# Patient Record
Sex: Female | Born: 1980
Health system: Southern US, Community
[De-identification: ages and names within clinical notes are randomized; demographics above are authoritative.]

## PROBLEM LIST (undated history)

## (undated) DIAGNOSIS — D3910 Neoplasm of uncertain behavior of unspecified ovary: Secondary | ICD-10-CM

## (undated) DIAGNOSIS — D219 Benign neoplasm of connective and other soft tissue, unspecified: Secondary | ICD-10-CM

## (undated) DIAGNOSIS — Z5189 Encounter for other specified aftercare: Secondary | ICD-10-CM

## (undated) DIAGNOSIS — D649 Anemia, unspecified: Secondary | ICD-10-CM

## (undated) HISTORY — PX: ABDOMINAL HYSTERECTOMY: SHX81

## (undated) HISTORY — PX: ABDOMINAL SURGERY: SHX537

---

## 2013-12-11 ENCOUNTER — Emergency Department (HOSPITAL_BASED_OUTPATIENT_CLINIC_OR_DEPARTMENT_OTHER)
Admission: EM | Admit: 2013-12-11 | Discharge: 2013-12-11 | Disposition: A | Discharge status: Home / Self Care | Payer: BC Managed Care – PPO | Attending: Emergency Medicine | Admitting: Emergency Medicine

## 2013-12-11 ENCOUNTER — Encounter (HOSPITAL_BASED_OUTPATIENT_CLINIC_OR_DEPARTMENT_OTHER): Payer: Self-pay | Admitting: Emergency Medicine

## 2013-12-11 DIAGNOSIS — F17200 Nicotine dependence, unspecified, uncomplicated: Secondary | ICD-10-CM | POA: Insufficient documentation

## 2013-12-11 DIAGNOSIS — N644 Mastodynia: Secondary | ICD-10-CM | POA: Insufficient documentation

## 2013-12-11 NOTE — ED Notes (Signed)
Pt states that she noticed a knot on her right breast, under her arm. Pt states she just noticed it today and was concerned when she started having pain in the area as well.

## 2013-12-11 NOTE — Discharge Instructions (Signed)
Breast Tenderness Breast tenderness is a common problem for women of all ages. Breast tenderness may cause mild discomfort to severe pain. It has a variety of causes. Your health care provider will find out the likely cause of your breast tenderness by examining your breasts, asking you about symptoms, and ordering some tests. Breast tenderness usually does not mean you have breast cancer. HOME CARE INSTRUCTIONS  Breast tenderness often can be handled at home. You can try:  Getting fitted for a new bra that provides more support, especially during exercise.  Wearing a more supportive bra or sports bra while sleeping when your breasts are very tender.  If you have a breast injury, apply ice to the area:  Put ice in a plastic bag.  Place a towel between your skin and the bag.  Leave the ice on for 20 minutes, 2 3 times a day.  If your breasts are too full of milk as a result of breastfeeding, try:  Expressing milk either by hand or with a breast pump.  Applying a warm compress to the breasts for relief.  Taking over-the-counter pain relievers, if approved by your health care provider.  Taking other medicines that your health care provider prescribes. These may include antibiotic medicines or birth control pills. Over the long term, your breast tenderness might be eased if you:  Cut down on caffeine.  Reduce the amount of fat in your diet. Keep a log of the days and times when your breasts are most tender. This will help you and your health care provider find the cause of the tenderness and how to relieve it. Also, learn how to do breast exams at home. This will help you notice if you have an unusual growth or lump that could cause tenderness. SEEK MEDICAL CARE IF:   Any part of your breast is hard, red, and hot to the touch. This could be a sign of infection.  Fluid is coming out of your nipples (and you are not breastfeeding). Especially watch for blood or pus.  You have a fever  as well as breast tenderness.  You have a new or painful lump in your breast that remains after your menstrual period ends.  You have tried to take care of the pain at home, but it has not gone away.  Your breast pain is getting worse, or the pain is making it hard to do the things you usually do during your day. Document Released: 06/26/2008 Document Revised: 03/16/2013 Document Reviewed: 02/10/2013 [employer] Patient Information 2014 [employer]  

## 2013-12-11 NOTE — ED Provider Notes (Signed)
CSN: [id number]     Arrival date & time 12/11/13  1942 History  This chart was scribed for Julie Relic, MD by Marcha Dutton, ED Scribe. This patient was seen in room MH12/MH12 and the patient's care was started at 9:31 PM.    Chief Complaint  Patient presents with  . Breast Pain    The history is provided by the patient. No language interpreter was used.    HPI Comments: Karelly Dewalt is a 33 y.o. female who presents to the Emergency Department complaining of a lump to her right breast that she noticed a month ago. Pt notes it had been painless until yesterday when it became sore. Pt denies any abdominal pain, axilla pain, discharge and redness to the area, fever, and muscular strain.   History reviewed. No pertinent past medical history. History reviewed. No pertinent past surgical history. History reviewed. No pertinent family history.   History  Substance Use Topics  . Smoking status: Current Every Day Smoker  . Smokeless tobacco: Not on file  . Alcohol Use: Yes    OB History   Grav Para Term Preterm Abortions TAB SAB Ect Mult Living                  Review of Systems 10 Systems reviewed and all are negative for acute change except as noted in the HPI.   Allergies  Review of patient's allergies indicates no known allergies.   Home Medications   Prior to Admission medications   Not on File    Triage Vitals: BP 110/71  Pulse 78  Temp(Src) 98.8 F (37.1 C) (Oral)  Resp 18  Ht 5' (1.524 m)  Wt 95 lb (43.092 kg)  BMI 18.55 kg/m2  SpO2 100%  LMP 12/10/2013   Physical Exam  Nursing note and vitals reviewed. Constitutional: She is oriented to person, place, and time. She appears well-developed and well-nourished. No distress.  Awake, alert, nontoxic appearance.  HENT:  Head: Normocephalic and atraumatic.  Eyes: EOM are normal. Right eye exhibits no discharge. Left eye exhibits no discharge.  Neck: Neck supple.  Cardiovascular: Normal rate, regular  rhythm and normal heart sounds.  Exam reveals no gallop and no friction rub.   No murmur heard. Pulmonary/Chest: Effort normal and breath sounds normal. No respiratory distress. She has no wheezes. She has no rales. She exhibits no tenderness.  Abdominal: Soft. Bowel sounds are normal. There is no tenderness. There is no rebound and no guarding.  Musculoskeletal: She exhibits no tenderness.  Baseline ROM, no obvious new focal weakness. Entire upper half of the right breast diffusely tender without erythema, induration, or palpable mass. No discharge from right nipple. No palpable right breast mass. [place] present.  Neurological: She is alert and oriented to person, place, and time.  Mental status and motor strength appears baseline for patient and situation.  Skin: Skin is warm and dry. No rash noted. She is not diaphoretic.  Psychiatric: She has a normal mood and affect. Her behavior is normal.    ED Course  Procedures (including critical care time)   DIAGNOSTIC STUDIES: Oxygen Saturation is 100% on RA, normal by my interpretation.     COORDINATION OF CARE: 9:37 PM- Patient informed of clinical course, understand medical decision-making process, and agree with plan.   Labs Review Labs Reviewed - No data to display   Imaging Review No results found.    EKG Interpretation None     MDM  The patient appears reasonably  screened and/or stabilized for discharge and I doubt any other medical condition or other [facility] requiring further screening, evaluation, or treatment in the ED at this time prior to discharge.   Final diagnoses:  Breast pain in female    I doubt any other EMC precluding discharge at this time including, but not necessarily limited to the following:abscess requiring I&D at this time.  I personally performed the services described in this documentation, which was scribed in my presence. The recorded information has been reviewed and is accurate.    Julie Relic, MD 12/13/13 [phone]

## 2014-03-12 ENCOUNTER — Emergency Department (HOSPITAL_BASED_OUTPATIENT_CLINIC_OR_DEPARTMENT_OTHER)
Admission: EM | Admit: 2014-03-12 | Discharge: 2014-03-12 | Disposition: A | Discharge status: Home / Self Care | Payer: No Typology Code available for payment source | Attending: Emergency Medicine | Admitting: Emergency Medicine

## 2014-03-12 ENCOUNTER — Encounter (HOSPITAL_BASED_OUTPATIENT_CLINIC_OR_DEPARTMENT_OTHER): Payer: Self-pay | Admitting: Emergency Medicine

## 2014-03-12 ENCOUNTER — Emergency Department (HOSPITAL_BASED_OUTPATIENT_CLINIC_OR_DEPARTMENT_OTHER): Payer: No Typology Code available for payment source

## 2014-03-12 DIAGNOSIS — S298XXA Other specified injuries of thorax, initial encounter: Secondary | ICD-10-CM | POA: Insufficient documentation

## 2014-03-12 DIAGNOSIS — Y9389 Activity, other specified: Secondary | ICD-10-CM | POA: Diagnosis not present

## 2014-03-12 DIAGNOSIS — Y92410 Unspecified street and highway as the place of occurrence of the external cause: Secondary | ICD-10-CM | POA: Diagnosis not present

## 2014-03-12 DIAGNOSIS — F17200 Nicotine dependence, unspecified, uncomplicated: Secondary | ICD-10-CM | POA: Diagnosis not present

## 2014-03-12 DIAGNOSIS — M7918 Myalgia, other site: Secondary | ICD-10-CM

## 2014-03-12 LAB — URINALYSIS, ROUTINE W REFLEX MICROSCOPIC
Bilirubin Urine: NEGATIVE
Glucose, UA: NEGATIVE mg/dL
Hgb urine dipstick: NEGATIVE
Ketones, ur: NEGATIVE mg/dL
NITRITE: NEGATIVE
PH: 8.5 — AB (ref 5.0–8.0)
Protein, ur: 30 mg/dL — AB
Specific Gravity, Urine: 1.022 (ref 1.005–1.030)
UROBILINOGEN UA: 1 mg/dL (ref 0.0–1.0)

## 2014-03-12 LAB — URINE MICROSCOPIC-ADD ON

## 2014-03-12 MED ORDER — IBUPROFEN 800 MG PO TABS: 800.0000 mg | ORAL_TABLET | Freq: Once | ORAL | Status: DC
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
## 2014-03-13 NOTE — ED Provider Notes (Signed)
Medical screening examination/treatment/procedure(s) were performed by non-physician practitioner and as supervising physician I was immediately available for consultation/collaboration.   EKG Interpretation None       Orlie Dakin, MD 03/13/14 0003

## 2014-03-14 LAB — URINE CULTURE

## 2014-07-25 ENCOUNTER — Encounter (HOSPITAL_BASED_OUTPATIENT_CLINIC_OR_DEPARTMENT_OTHER): Payer: Self-pay | Admitting: *Deleted

## 2014-07-25 ENCOUNTER — Emergency Department (HOSPITAL_BASED_OUTPATIENT_CLINIC_OR_DEPARTMENT_OTHER)
Admission: EM | Admit: 2014-07-25 | Discharge: 2014-07-25 | Disposition: A | Discharge status: Home / Self Care | Payer: BC Managed Care – PPO | Attending: Emergency Medicine | Admitting: Emergency Medicine

## 2014-07-25 DIAGNOSIS — J028 Acute pharyngitis due to other specified organisms: Secondary | ICD-10-CM

## 2014-07-25 DIAGNOSIS — B9689 Other specified bacterial agents as the cause of diseases classified elsewhere: Secondary | ICD-10-CM | POA: Insufficient documentation

## 2014-07-25 DIAGNOSIS — R109 Unspecified abdominal pain: Secondary | ICD-10-CM | POA: Insufficient documentation

## 2014-07-25 DIAGNOSIS — Z86018 Personal history of other benign neoplasm: Secondary | ICD-10-CM | POA: Insufficient documentation

## 2014-07-25 DIAGNOSIS — Z720 Tobacco use: Secondary | ICD-10-CM | POA: Insufficient documentation

## 2014-07-25 HISTORY — DX: Neoplasm of uncertain behavior of unspecified ovary: D39.10

## 2014-07-25 LAB — RAPID STREP SCREEN (MED CTR MEBANE ONLY): STREPTOCOCCUS, GROUP A SCREEN (DIRECT): NEGATIVE

## 2014-07-25 MED ORDER — ACETAMINOPHEN 325 MG PO TABS
650.0000 mg | ORAL_TABLET | Freq: Once | ORAL | Status: AC
  Administered 2014-07-25: 650 mg via ORAL
  Filled 2014-07-25: qty 2

## 2014-07-25 NOTE — ED Notes (Signed)
Patient states she has a two week history of a sore throat with swollen tonsils.  This morning swelling is worse.  Denies fever.

## 2014-07-25 NOTE — ED Provider Notes (Signed)
CSN: [id number]     Arrival date & time 07/25/14  1502 History   First MD Initiated Contact with Patient 07/25/14 1624     Chief Complaint  Patient presents with  . Sore Throat     (Consider location/radiation/quality/duration/timing/severity/associated sxs/prior Treatment) HPI Comments: 33 year old female with no significant medical history except smoking presents with sore throat for 10 days swollen tonsils. No sick contacts. No infectious disease history. Pain with swallowing  Patient is a 33 y.o. female presenting with pharyngitis. The history is provided by the patient.  Sore Throat Pertinent negatives include no abdominal pain and no headaches.    Past Medical History  Diagnosis Date  . Granulosa cell tumor of ovary    Past Surgical History  Procedure Laterality Date  . Abdominal surgery      ovary tumor   No family history on file. History  Substance Use Topics  . Smoking status: Current Every Day Smoker  . Smokeless tobacco: Not on file  . Alcohol Use: Yes   OB History    No data available     Review of Systems  Constitutional: Negative for fever and chills.  HENT: Positive for sore throat.   Gastrointestinal: Negative for vomiting and abdominal pain.  Genitourinary: Positive for flank pain.  Musculoskeletal: Negative for back pain, neck pain and neck stiffness.  Skin: Negative for rash.  Neurological: Negative for light-headedness and headaches.      Allergies  Review of patient's allergies indicates no known allergies.  Home Medications   Prior to Admission medications   Not on File   BP 124/76 mmHg  Pulse 94  Temp(Src) 98.6 F (37 C) (Oral)  Resp 16  SpO2 100%  LMP 07/18/2014 (Exact Date) Physical Exam  Constitutional: She is oriented to person, place, and time. She appears well-developed and well-nourished.  HENT:  Head: Normocephalic and atraumatic.  Mild erythema posterior pharynx, no trismus, no unilateral swelling  Eyes: Right eye  exhibits no discharge. Left eye exhibits no discharge.  Neck: Normal range of motion. Neck supple. No tracheal deviation present.  Cardiovascular: Normal rate.   Neurological: She is alert and oriented to person, place, and time.  Skin: Skin is warm. No rash noted.  Psychiatric: She has a normal mood and affect.  Nursing note and vitals reviewed.   ED Course  Procedures (including critical care time) Labs Review Labs Reviewed  RAPID STREP SCREEN  CULTURE, GROUP A STREP    Imaging Review No results found.   EKG Interpretation None      MDM   Final diagnoses:  Acute pharyngitis due to other specified organisms   Well-appearing overall healthy female with clinical pharyngitis. Strep negative. Discussed supportive care and outpatient follow  Results and differential diagnosis were discussed with the patient/parent/guardian. Close follow up outpatient was discussed, comfortable with the plan.   Medications  acetaminophen (TYLENOL) tablet 650 mg (650 mg Oral Given 07/25/14 1716)    Filed Vitals:   07/25/14 1518 07/25/14 1717  BP: 103/46 124/76  Pulse: 94   Temp: 98.6 F (37 C)   TempSrc: Oral   Resp: 18 16  SpO2: 100% 100%    Final diagnoses:  Acute pharyngitis due to other specified organisms       Mariea Clonts, MD 07/25/14 1722

## 2014-07-25 NOTE — Discharge Instructions (Signed)
If you were given medicines take as directed.  If you are on coumadin or contraceptives realize their levels and effectiveness is altered by many different medicines.  If you have any reaction (rash, tongues swelling, other) to the medicines stop taking and see a physician.   Please follow up as directed and return to the ER or see a physician for new or worsening symptoms.  Thank you. Filed Vitals:   07/25/14 1518  BP: 103/46  Pulse: 94  Temp: 98.6 F (37 C)  TempSrc: Oral  Resp: 18  SpO2: 100%

## 2014-07-27 LAB — CULTURE, GROUP A STREP

## 2015-06-05 ENCOUNTER — Encounter (HOSPITAL_BASED_OUTPATIENT_CLINIC_OR_DEPARTMENT_OTHER): Payer: Self-pay | Admitting: *Deleted

## 2015-06-05 ENCOUNTER — Emergency Department (HOSPITAL_BASED_OUTPATIENT_CLINIC_OR_DEPARTMENT_OTHER)
Admission: EM | Admit: 2015-06-05 | Discharge: 2015-06-05 | Disposition: A | Discharge status: Home / Self Care | Payer: BLUE CROSS/BLUE SHIELD | Attending: Emergency Medicine | Admitting: Emergency Medicine

## 2015-06-05 DIAGNOSIS — J111 Influenza due to unidentified influenza virus with other respiratory manifestations: Secondary | ICD-10-CM | POA: Diagnosis not present

## 2015-06-05 DIAGNOSIS — Z86018 Personal history of other benign neoplasm: Secondary | ICD-10-CM | POA: Insufficient documentation

## 2015-06-05 DIAGNOSIS — R059 Cough, unspecified: Secondary | ICD-10-CM

## 2015-06-05 DIAGNOSIS — Z720 Tobacco use: Secondary | ICD-10-CM | POA: Insufficient documentation

## 2015-06-05 DIAGNOSIS — R05 Cough: Secondary | ICD-10-CM

## 2015-06-05 DIAGNOSIS — M791 Myalgia: Secondary | ICD-10-CM | POA: Diagnosis present

## 2015-06-05 DIAGNOSIS — R69 Illness, unspecified: Secondary | ICD-10-CM

## 2015-06-05 HISTORY — DX: Benign neoplasm of connective and other soft tissue, unspecified: D21.9

## 2015-06-05 MED ORDER — BENZONATATE 100 MG PO CAPS: 200.0000 mg | ORAL_CAPSULE | Freq: Two times a day (BID) | ORAL | Status: DC | PRN

## 2015-06-05 MED ORDER — IBUPROFEN 800 MG PO TABS
800.0000 mg | ORAL_TABLET | Freq: Once | ORAL | Status: AC
  Administered 2015-06-05: 800 mg via ORAL
  Filled 2015-06-05: qty 1

## 2015-06-05 MED ORDER — IBUPROFEN 800 MG PO TABS: 800.0000 mg | ORAL_TABLET | Freq: Three times a day (TID) | ORAL | Status: DC

## 2015-06-05 NOTE — ED Notes (Signed)
Body aches, chills, cough, and headache. Symptoms x 2 days.

## 2015-06-05 NOTE — ED Provider Notes (Signed)
CSN: [id number]     Arrival date & time 06/05/15  1428 History   First MD Initiated Contact with Patient 06/05/15 1446     Chief Complaint  Patient presents with  . Generalized Body Aches     (Consider location/radiation/quality/duration/timing/severity/associated sxs/prior Treatment) The history is provided by the patient and medical records. No language interpreter was used.    Julie Novak is a 34 y.o. 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 Patient reports that her headache is mild, generalized and throbbing. No vision changes or diplopia. No neck stiffness or neck pain. Patient denies known tick bites. She denies fevers at home. Patient denies rash.  Patient reports drinking some TheraFlu last night without relief. No other treatments prior to arrival. She denies sick contacts. She also denies chest pain, shortness of breath, wheezing, abdominal pain, nausea, vomiting, diarrhea, weakness, dizziness, syncope or dysuria.  Past Medical History  Diagnosis Date  . Granulosa cell tumor of ovary   . Granular cell tumor    Past Surgical History  Procedure Laterality Date  . Abdominal surgery      ovary tumor   No family history on file. Social History  Substance Use Topics  . Smoking status: Current Some Day Smoker    Types: Cigarettes  . Smokeless tobacco: None  . Alcohol Use: Yes   OB History    No data available     Review of Systems  Constitutional: Positive for fatigue. Negative for fever, chills and appetite change.  HENT: Positive for congestion ( minimal) and sore throat. Negative for ear discharge, ear pain, mouth sores, postnasal drip, rhinorrhea and sinus pressure.   Eyes: Negative for visual disturbance.  Respiratory: Positive for cough. Negative for chest  tightness, shortness of breath, wheezing and stridor.   Cardiovascular: Negative for chest pain, palpitations and leg swelling.  Gastrointestinal: Negative for nausea, vomiting, abdominal pain and diarrhea.  Genitourinary: Negative for dysuria, urgency, frequency and hematuria.  Musculoskeletal: Negative for myalgias, back pain, arthralgias and neck stiffness.  Skin: Negative for rash.  Neurological: Positive for headaches. Negative for syncope, light-headedness and numbness.  Hematological: Negative for adenopathy.  Psychiatric/Behavioral: The patient is not nervous/anxious.   All other systems reviewed and are negative.     Allergies  Review of patient's allergies indicates no known allergies.  Home Medications   Prior to Admission medications   Medication Sig Start Date End Date Taking? Authorizing Provider  benzonatate (TESSALON) 100 MG capsule Take 2 capsules (200 mg total) by mouth 2 (two) times daily as needed for cough. 06/05/15   Aamilah Augenstein, PA-C  ibuprofen (ADVIL,MOTRIN) 800 MG tablet Take 1 tablet (800 mg total) by mouth 3 (three) times daily. 06/05/15   Rodrick Payson, PA-C   BP 115/65 mmHg  Pulse 95  Temp(Src) 98.2 F (36.8 C) (Oral)  Resp 18  Ht 5' (1.524 m)  Wt 95 lb (43.092 kg)  BMI 18.55 kg/m2  SpO2 100%  LMP 06/04/2015 Physical Exam  Constitutional: She is oriented to person, place, and time. She appears well-developed and well-nourished. No distress.  HENT:  Head: Normocephalic and atraumatic.  Right Ear: Tympanic membrane, external ear and ear canal normal.  Left Ear: Tympanic membrane, external ear and ear canal normal.  Nose: Mucosal edema and rhinorrhea present. No epistaxis. Right sinus exhibits no maxillary sinus  tenderness and no frontal sinus tenderness. Left sinus exhibits no maxillary sinus tenderness and no frontal sinus tenderness.  Mouth/Throat: Uvula is midline, oropharynx is clear and moist and mucous membranes are normal. Mucous  membranes are not pale and not cyanotic. No oropharyngeal exudate, posterior oropharyngeal edema, posterior oropharyngeal erythema or tonsillar abscesses.  Eyes: Conjunctivae are normal. Pupils are equal, round, and reactive to light.  Neck: Normal range of motion and full passive range of motion without pain.  Cardiovascular: Normal rate, normal heart sounds and intact distal pulses.   No murmur heard. Pulmonary/Chest: Effort normal and breath sounds normal. No accessory muscle usage or stridor. No tachypnea. No respiratory distress. She has no decreased breath sounds. She has no wheezes. She has no rhonchi.  Clear and equal breath sounds without focal wheezes, rhonchi, rales  Abdominal: Soft. Bowel sounds are normal. There is no tenderness.  Musculoskeletal: Normal range of motion.  Lymphadenopathy:    She has no cervical adenopathy.  Neurological: She is alert and oriented to person, place, and time.  Skin: Skin is warm and dry. No rash noted. She is not diaphoretic.  Psychiatric: She has a normal mood and affect.  Nursing note and vitals reviewed.   ED Course  Procedures (including critical care time) Labs Review Labs Reviewed - No data to display  Imaging Review No results found. I have personally reviewed and evaluated these images and lab results as part of my medical decision-making.   EKG Interpretation None      MDM   Final diagnoses:  Influenza-like illness  Cough   Julie Novak presents with influenza like illness vs URI with cough.  Pt afebrile with clear and equal breath sounds and symptoms less than 48 hours.  Doubt PNA and no indication for CXR at this time.  Patients symptoms are consistent with URI, likely viral etiology. Discussed that antibiotics are not indicated for viral infections. Pt will be discharged with symptomatic treatment.  Verbalizes understanding and is agreeable with plan. Pt is hemodynamically stable & in NAD prior to dc.  BP 115/65 mmHg   Pulse 95  Temp(Src) 98.2 F (36.8 C) (Oral)  Resp 18  Ht 5' (1.524 m)  Wt 95 lb (43.092 kg)  BMI 18.55 kg/m2  SpO2 100%  LMP 06/04/2015    Jarrett Soho Makia Bossi, PA-C 06/05/15 1520  Pattricia Boss, MD 06/08/15 [phone]

## 2015-06-05 NOTE — Discharge Instructions (Signed)
1. Medications: mucinex, tessalon, usual home medications 2. Treatment: rest, drink plenty of fluids, take tylenol or ibuprofen for fever control and body aches 3. Follow Up: Please followup with your primary doctor in 3 days for discussion of your diagnoses and further evaluation after today's visit; if you do not have a primary care doctor use the resource guide provided to find one; Return to the ER for high fevers, difficulty breathing or other concerning symptoms     Cough, Adult Coughing is a reflex that clears your throat and your airways. Coughing helps to heal and protect your lungs. It is normal to cough occasionally, but a cough that happens with other symptoms or lasts a long time may be a sign of a condition that needs treatment. A cough may last only 2-3 weeks (acute), or it may last longer than 8 weeks (chronic). CAUSES Coughing is commonly caused by:  Breathing in substances that irritate your lungs.  A viral or bacterial respiratory infection.  Allergies.  Asthma.  Postnasal drip.  Smoking.  Acid backing up from the stomach into the esophagus (gastroesophageal reflux).  Certain medicines.  Chronic lung problems, including COPD (or rarely, lung cancer).  Other medical conditions such as heart failure. HOME CARE INSTRUCTIONS  Pay attention to any changes in your symptoms. Take these actions to help with your discomfort:  Take medicines only as told by your health care provider.  If you were prescribed an antibiotic medicine, take it as told by your health care provider. Do not stop taking the antibiotic even if you start to feel better.  Talk with your health care provider before you take a cough suppressant medicine.  Drink enough fluid to keep your urine clear or pale yellow.  If the air is dry, use a cold steam vaporizer or humidifier in your bedroom or your home to help loosen secretions.  Avoid anything that causes you to cough at work or at home.  If  your cough is worse at night, try sleeping in a semi-upright position.  Avoid cigarette smoke. If you smoke, quit smoking. If you need help quitting, ask your health care provider.  Avoid caffeine.  Avoid alcohol.  Rest as needed. SEEK MEDICAL CARE IF:   You have new symptoms.  You cough up pus.  Your cough does not get better after 2-3 weeks, or your cough gets worse.  You cannot control your cough with suppressant medicines and you are losing sleep.  You develop pain that is getting worse or pain that is not controlled with pain medicines.  You have a fever.  You have unexplained weight loss.  You have night sweats. SEEK IMMEDIATE MEDICAL CARE IF:  You cough up blood.  You have difficulty breathing.  Your heartbeat is very fast.   This information is not intended to replace advice given to you by your health care provider. Make sure you discuss any questions you have with your health care provider.   Document Released: 01/10/2011 Document Revised: 04/04/2015 Document Reviewed: 09/20/2014 Elsevier Interactive Patient Education 2016 [employer].    Emergency Department Resource Guide 1) Find a Doctor and Pay Out of Pocket Although you won't have to find out who is covered by your insurance plan, it is a good idea to ask around and get recommendations. You will then need to call the office and see if the doctor you have chosen will accept you as a new patient and what types of options they offer for patients who are  self-pay. Some doctors offer discounts or will set up payment plans for their patients who do not have insurance, but you will need to ask so you aren't surprised when you get to your appointment.  2) Contact Your Local Health Department Not all health departments have doctors that can see patients for sick visits, but many do, so it is worth a call to see if yours does. If you don't know where your local health department is, you can check in your phone  book. The CDC also has a tool to help you locate your state's health department, and many state websites also have listings of all of their local health departments.  3) Find a [facility] If your illness is not likely to be very severe or complicated, you may want to try a walk in clinic. These are popping up all over the country in pharmacies, drugstores, and shopping centers. They're usually staffed by nurse practitioners or physician assistants that have been trained to treat common illnesses and complaints. They're usually fairly quick and inexpensive. However, if you have serious medical issues or chronic medical problems, these are probably not your best option.  No Primary Care Doctor: - Call Health Connect at  [phone] - they can help you locate a primary care doctor that  accepts your insurance, provides certain services, etc. - Physician Referral Service- [phone]  Chronic Pain Problems: Organization         Address  Phone   Notes  [facility]  [phone] Patients need to be referred by their primary care doctor.   Medication Assistance: Organization         Address  Phone   Notes  [facility] [address] [place], [place] [address] [phone] --Must be a resident of [place] -- Must have NO insurance coverage whatsoever (no Medicaid/ Medicare, etc.) -- The pt. MUST have a primary care doctor that directs their care regularly and follows them in the community   MedAssist  [phone]   [employer]  [phone]    Agencies that provide inexpensive medical care: Organization         Address  Phone   Notes  [place]  [phone]   [facility]    [phone]   [facility] [place], [place] [address] [phone]   [place] [address] [place] [phone]   Planned  Parenthood    [phone]   [facility]    [phone]   [facility] [address], [place] Phone:  [phone], Fax:  [phone] Hours of Operation:  9 am - 6 pm, M-F.  Also accepts Medicaid/Medicare and self-pay.  [facility] for [place] [address] [place], [address], [place] Phone: [phone], Fax: [phone]. Hours of Operation:  8:30 am - 5:30 pm, M-F.  Also accepts Medicaid and self-pay.  [facility] [place] [address] [place] Phone: [phone]   [place], [place], [place] [phone], Ext. [phone] Mondays & Thursdays: 7-9 AM.  First 15 patients are seen on a first come, first serve basis.    [place] Providers:  Organization         Address  Phone   Notes  [facility] [address], Arlis Porta, [place] [phone] Also accepts self-pay patients.  [facility] [address], [place]  [phone]   [place], [address], [place] [phone]   [facility] [address], [place] [phone]   Lucianne Lei [address], [place]   [phone] Only accepts [place] Access [place] patients after they have their name applied to their card.   Self-Pay (no insurance) in [place] [facility] [place]:  Organization         Address  Phone   Notes  Sickle Cell Patients, [facility] [place] [phone]   [facility] [place] [phone]   [facility] [place]  [place], [place], [place] [phone]   [facility]/Dr. Osei-Bonsu  [address] [place] or [address], [place] [phone] Phone number for both [place] and [place] locations is the same.    [facility] [address], [place] [phone]   [facility] [address], [place] or [address] [phone]   [facility] [address], [place] [phone], phone; [phone], fax Sees patients 1st and 3rd Saturday of every month.  Must not qualify for public or private insurance (i.e. Medicaid, Medicare, [place] Health Choice, Veterans' Benefits)  Household income should be no more than 200% of the poverty level The clinic cannot treat you if you are pregnant or think you are pregnant  Sexually transmitted diseases are not treated at the clinic.    Dental Care: Organization         Address  Phone  Notes  [facility] [place] [phone] Accepts children up to age 37 who are enrolled in [place] or [place]; pregnant women with a Medicaid card; and children who have applied for Medicaid or [place] Health Choice, but were declined, whose parents can pay a reduced fee at time of service.  [facility]  [address], [place] [phone] Accepts children up to age 31 who are enrolled in [place] or [place]; pregnant women with a Medicaid card; and children who have applied for Medicaid or [place] Health Choice, but were declined, whose parents can pay a reduced fee at time of service.  [place] Adult [facility] Access PROGRAM  [place] [phone] Patients are seen by appointment only. Walk-ins are not accepted. [place] will see patients 14 years of age and older. Monday - Tuesday (8am-5pm) Most Wednesdays (8:30-5pm) $30 per visit, cash only  [facility] Adult [facility] Access PROGRAM  [address], [facility] [phone] Patients are seen by appointment only. Walk-ins are not accepted. [place] will see patients 80  years of age and older. One Wednesday Evening (Monthly: Volunteer Based).  $30 per visit, cash only  [place]  [phone] for adults; Children under age 2, call Graduate Pediatric Dentistry at [phone]. Children aged 5-14, please call [phone] to request a pediatric application.  Dental services are provided in all areas of dental care including fillings, crowns and bridges, complete and partial dentures, implants, gum treatment, root canals,  and extractions. Preventive care is also provided. Treatment is provided to both adults and children. Patients are selected via a lottery and there is often a waiting list.   [facility] [address], [place]  [phone] [email or web address]   [facility] [address] [place], [place] [phone], Ext. [phone] Second and Fourth Thursday of each month, opens at 6:30 AM; Clinic ends at 9 AM.  Patients are seen on a first-come first-served basis, and a limited number are seen during each clinic.   [facility]  [address] Hillard Danker [place], [place] [phone]   Eligibility Requirements You must have lived in [place], [place], or [place] counties for at least the last three months.   You cannot be eligible for state or federal sponsored [employer], including [employer], [place], or [employer].   You generally cannot be eligible for healthcare insurance through your employer.    How to apply: Eligibility screenings are held every Tuesday and Wednesday afternoon from 1:00 pm until 4:00 pm. You do not need an appointment for the interview!  [facility] [address], [place], [place]   [place]  [place] Department  [place]  [phone]    Behavioral Health Resources in the Community: Intensive Outpatient  Programs Organization         Address  Phone  Notes  [place]. [address] [place], [place] [phone]   [facility] Outpatient [address] [place], [place]   ADS: Alcohol & Drug Svcs [address] [place], [place]   [place] [address],  [place], [place] or [phone]   Substance Abuse Resources Organization         Address  Phone  Notes  Alcohol and Drug Services  [phone]   [place]  [phone]   [facility] [place]   Chinita Pester  [phone]   Residential & Outpatient Substance Abuse Program  [phone]   Psychological Services Organization         Address  Phone  Notes  [facility] [place]  [place]  [phone]   [place] [address], [place] or [phone]    Mobile Crisis Teams Organization         Address  Phone  Notes  Therapeutic Alternatives, Mobile Crisis Care Unit  [phone]   [facility]  [address] [place], [place]   [place] [address], [place] [phone]    Self-Help/Support Groups Organization         Address  Phone             Notes  [place]. of [place] - variety of support groups  [place] Call for more information  Narcotics Anonymous (NA), [facility]aring [facility] [address], [employer] [place]  2 meetings at this location   Special educational needs teacher         Address  Phone  Notes  ASAP Residential Treatment [place],    [place]  [phone]   [facility]  [address] [place] [phone], [place], [place]   [place], [place] [phone] Admissions: 8am-3pm M-F  Incentives Substance [place] [address],    [place], [place]  [phone]   [facility] [address] [place], [place], [place]   [facility] [address]  [place], [place]   [facility]ight Programs - Intensive Outpatient [address], [place], [place]   [facility] ([place].,  [place], [place] [phone] or [phone]   [facility] (RTS) [address], [place], [place] Accepts Medicaid  [facility] [place] [address],  [place] [phone] Substance Abuse/Addiction Treatment   [facility] Organization         Address  Phone  Notes  [facility]  [phone]   Domenic Schwab, PhD [address] Arlis Porta [place], [place]   [phone] or [phone]   [place], [place] [phone]   [facility] [address], [place], [place] [phone] Insurance/Medicaid/sponsorship through [employer] and Families [address]                                    [place], [place] [phone] [place] [address], [place] [phone]    Dr. Adele Schilder  [phone]   [facility] Dept. 1) [address], [place] 2) [place] 3)  [place] [address], [place] [phone]  [phone]   [place] [phone] or [phone] (After Hours)

## 2015-12-18 ENCOUNTER — Encounter (HOSPITAL_BASED_OUTPATIENT_CLINIC_OR_DEPARTMENT_OTHER): Payer: Self-pay | Admitting: Emergency Medicine

## 2015-12-18 ENCOUNTER — Emergency Department (HOSPITAL_BASED_OUTPATIENT_CLINIC_OR_DEPARTMENT_OTHER)
Admission: EM | Admit: 2015-12-18 | Discharge: 2015-12-18 | Disposition: A | Discharge status: Home / Self Care | Payer: BLUE CROSS/BLUE SHIELD | Attending: Emergency Medicine | Admitting: Emergency Medicine

## 2015-12-18 DIAGNOSIS — S61214A Laceration without foreign body of right ring finger without damage to nail, initial encounter: Secondary | ICD-10-CM | POA: Diagnosis not present

## 2015-12-18 DIAGNOSIS — W25XXXA Contact with sharp glass, initial encounter: Secondary | ICD-10-CM | POA: Insufficient documentation

## 2015-12-18 DIAGNOSIS — IMO0002 Reserved for concepts with insufficient information to code with codable children: Secondary | ICD-10-CM

## 2015-12-18 DIAGNOSIS — Y999 Unspecified external cause status: Secondary | ICD-10-CM | POA: Diagnosis not present

## 2015-12-18 DIAGNOSIS — Y9389 Activity, other specified: Secondary | ICD-10-CM | POA: Insufficient documentation

## 2015-12-18 DIAGNOSIS — Z23 Encounter for immunization: Secondary | ICD-10-CM | POA: Insufficient documentation

## 2015-12-18 DIAGNOSIS — Y929 Unspecified place or not applicable: Secondary | ICD-10-CM | POA: Diagnosis not present

## 2015-12-18 DIAGNOSIS — F17200 Nicotine dependence, unspecified, uncomplicated: Secondary | ICD-10-CM | POA: Insufficient documentation

## 2015-12-18 MED ORDER — MUPIROCIN 2 % EX OINT: TOPICAL_OINTMENT | CUTANEOUS | Status: AC

## 2015-12-18 MED ORDER — TETANUS-DIPHTH-ACELL PERTUSSIS 5-2.5-18.5 LF-MCG/0.5 IM SUSP
0.5000 mL | Freq: Once | INTRAMUSCULAR | Status: AC
  Administered 2015-12-18: 0.5 mL via INTRAMUSCULAR
  Filled 2015-12-18: qty 0.5

## 2015-12-18 MED ORDER — LIDOCAINE-EPINEPHRINE-TETRACAINE (LET) SOLUTION
3.0000 mL | Freq: Once | NASAL | Status: AC
  Administered 2015-12-18: 3 mL via TOPICAL
  Filled 2015-12-18: qty 3

## 2015-12-18 NOTE — ED Notes (Signed)
0.5cm laceration to middle of right middle finger, superficial, 1cm laceration to same spot on right ring finger, slightly deeper, both approximate well and bleeding is controlled with bandage

## 2015-12-18 NOTE — ED Notes (Signed)
Pt verbalizes understanding of d/c instructions and denies any further needs at this time. 

## 2015-12-18 NOTE — ED Notes (Signed)
Pt in with lacerations to 3rd and 4th fingers on R hand from glass while washing dishes, dressed and bleeding controlled. Pt alert, interactive, in NAD.

## 2015-12-18 NOTE — ED Notes (Signed)
Provider now at bedside.

## 2015-12-18 NOTE — Discharge Instructions (Signed)
Keep wound clean with mild soap and water. Keep area covered with a topical antibiotic ointment and bandage, keep bandage dry, and do not submerge in water for 24 hours. Ice and elevate for additional pain relief and swelling. Ibuprofen for additional pain relief. Follow up with your primary care doctor or the [facility]gent [place] in approximately 7 days for wound recheck and suture removal. Monitor area for signs of infection to include, but not limited to: increasing pain, spreading redness, drainage/pus, worsening swelling, or fevers. Return to emergency department for emergent changing or worsening symptoms.   WOUND CARE  Keep area clean and dry for 24 hours. Do not remove bandage, if applied.  After 24 hours,you should change it at least once a day. Also, change the dressing if it becomes wet or dirty, or as directed by your caregiver.   Wash the wound with soap and water 2 times a day. Rinse the wound off with water to remove all soap. Pat the wound dry with a clean towel.   You may shower as usual after the first 24 hours. Do not soak the wound in water until the sutures are removed.   Do not apply any ointments or creams to the wound while stitches/staples are in place, as this may cause delayed healing.  Return if you experience any of the following signs of infection: Swelling, redness, pus drainage, streaking, fever >101.0 F  Return if you experience excessive bleeding that does not stop after 15-20 minutes of constant, firm pressure.

## 2015-12-18 NOTE — ED Provider Notes (Signed)
CSN: [id number]     Arrival date & time 12/18/15  1940 History   First MD Initiated Contact with Patient 12/18/15 2022     Chief Complaint  Patient presents with  . Extremity Laceration    (Consider location/radiation/quality/duration/timing/severity/associated sxs/prior Treatment) The history is provided by the patient and medical records. No language interpreter was used.   Julie Novak is a 34 y.o. female  who presents to the Emergency Department for a laceration of her right middle and ring finger which occurred just prior to arrival. Patient was washing the dishes when she cut her hand on glass. Patient is complaining of pain along her cut on her ring finger which is constant, but worse with movement. Denies numbness or tingling. Unknown tetanus status. No medications taken prior to arrival for symptoms.  Past Medical History  Diagnosis Date  . Granulosa cell tumor of ovary   . Granular cell tumor    Past Surgical History  Procedure Laterality Date  . Abdominal surgery      ovary tumor   History reviewed. No pertinent family history. Social History  Substance Use Topics  . Smoking status: Current Some Day Smoker    Types: Cigarettes  . Smokeless tobacco: None  . Alcohol Use: Yes   OB History    No data available     Review of Systems  Skin: Positive for wound.  Neurological: Negative for numbness.      Allergies  Review of patient's allergies indicates no known allergies.  Home Medications   Prior to Admission medications   Medication Sig Start Date End Date Taking? Authorizing Provider  benzonatate (TESSALON) 100 MG capsule Take 2 capsules (200 mg total) by mouth 2 (two) times daily as needed for cough. 06/05/15   Julie Muthersbaugh, PA-C  ibuprofen (ADVIL,MOTRIN) 800 MG tablet Take 1 tablet (800 mg total) by mouth 3 (three) times daily. 06/05/15   Julie Muthersbaugh, PA-C  mupirocin ointment (BACTROBAN) 2 % Apply to affected area twice daily. 12/18/15   Julie Novak  Novak Julie Loa, PA-C   BP 118/90 mmHg  Temp(Src) 98.7 F (37.1 C) (Oral)  Resp 18  Ht 5' (1.524 m)  Wt 45.36 kg  BMI 19.53 kg/m2  SpO2 100%  LMP 12/09/2015 Physical Exam  Constitutional: She is oriented to person, place, and time. She appears well-developed and well-nourished.  Alert and in no acute distress  HENT:  Head: Normocephalic and atraumatic.  Cardiovascular: Normal rate, regular rhythm and normal heart sounds.   No murmur heard. Pulmonary/Chest: Effort normal and breath sounds normal. No respiratory distress. She has no wheezes. She has no rales.  Musculoskeletal:  RUE with full ROM and 5/5 muscle strength including grip strength.   Neurological: She is alert and oriented to person, place, and time.  RUE neurovascularly intact.   Skin: Skin is warm and dry.  Right middle finger with superficial abrasion.  Right ring finger with 1.5 cm laceration.   Nursing note and vitals reviewed.   ED Course  Procedures (including critical care time)  LACERATION REPAIR Performed by: Julie Novak Authorized by: Julie Novak Consent: Verbal consent obtained. Risks and benefits: risks, benefits and alternatives were discussed Consent given by: patient Patient identity confirmed: provided demographic data Prepped and Draped in normal sterile fashion Wound explored Laceration Location: right ring finger Laceration Length: 1cm No Foreign Bodies seen or palpated Anesthesia: topical LET Irrigation method: syringe Amount of cleaning: standard Skin closure: 6-0  Number of sutures: 2 Technique: simple interrupted Patient  tolerance: Patient tolerated the procedure well with no immediate complications. Labs Review Labs Reviewed - No data to display  Imaging Review No results found. I have personally reviewed and evaluated these images and lab results as part of my medical decision-making.   EKG Interpretation None      MDM   Final diagnoses:  Laceration    Patient presents with superficial laceration to right middle finger and 1 cm laceration on the ring finger. Wounds thoroughly cleaned in ED. Laceration repaired as dictated above. Home care instructions and follow-up care for suture removal discussed. Return precautions given and all questions answered.    [facility] Julie Oatley, PA-C 12/18/15 2145  Julie Morgan, MD 12/19/15 [phone]

## 2016-04-07 ENCOUNTER — Emergency Department (HOSPITAL_BASED_OUTPATIENT_CLINIC_OR_DEPARTMENT_OTHER)
Admission: EM | Admit: 2016-04-07 | Discharge: 2016-04-07 | Disposition: A | Discharge status: Home / Self Care | Payer: BLUE CROSS/BLUE SHIELD | Attending: Emergency Medicine | Admitting: Emergency Medicine

## 2016-04-07 ENCOUNTER — Encounter (HOSPITAL_BASED_OUTPATIENT_CLINIC_OR_DEPARTMENT_OTHER): Payer: Self-pay | Admitting: Adult Health

## 2016-04-07 DIAGNOSIS — D649 Anemia, unspecified: Secondary | ICD-10-CM | POA: Diagnosis not present

## 2016-04-07 DIAGNOSIS — R0602 Shortness of breath: Secondary | ICD-10-CM | POA: Diagnosis present

## 2016-04-07 DIAGNOSIS — F17210 Nicotine dependence, cigarettes, uncomplicated: Secondary | ICD-10-CM | POA: Insufficient documentation

## 2016-04-07 HISTORY — DX: Anemia, unspecified: D64.9

## 2016-04-07 LAB — CBC
HCT: 27.3 % — ABNORMAL LOW (ref 36.0–46.0)
HEMOGLOBIN: 8.6 g/dL — AB (ref 12.0–15.0)
MCH: 20.2 pg — ABNORMAL LOW (ref 26.0–34.0)
MCHC: 31.5 g/dL (ref 30.0–36.0)
MCV: 64.2 fL — ABNORMAL LOW (ref 78.0–100.0)
Platelets: 333 10*3/uL (ref 150–400)
RBC: 4.25 MIL/uL (ref 3.87–5.11)
RDW: 19 % — AB (ref 11.5–15.5)
WBC: 4.5 10*3/uL (ref 4.0–10.5)

## 2016-04-07 LAB — URINALYSIS, ROUTINE W REFLEX MICROSCOPIC
Bilirubin Urine: NEGATIVE
Glucose, UA: NEGATIVE mg/dL
KETONES UR: NEGATIVE mg/dL
Nitrite: POSITIVE — AB
PH: 5.5 (ref 5.0–8.0)
Protein, ur: NEGATIVE mg/dL
Specific Gravity, Urine: 1.021 (ref 1.005–1.030)

## 2016-04-07 LAB — BASIC METABOLIC PANEL
ANION GAP: 6 (ref 5–15)
BUN: 8 mg/dL (ref 6–20)
CO2: 28 mmol/L (ref 22–32)
Calcium: 9.2 mg/dL (ref 8.9–10.3)
Chloride: 102 mmol/L (ref 101–111)
Creatinine, Ser: 0.5 mg/dL (ref 0.44–1.00)
GFR calc non Af Amer: >60 mL/min (ref >60)
Glucose, Bld: 88 mg/dL (ref 65–99)
POTASSIUM: 3.3 mmol/L — AB (ref 3.5–5.1)
Sodium: 136 mmol/L (ref 135–145)

## 2016-04-07 LAB — URINE MICROSCOPIC-ADD ON

## 2016-04-07 LAB — PREGNANCY, URINE: Preg Test, Ur: NEGATIVE

## 2016-04-07 MED ORDER — POTASSIUM CHLORIDE CRYS ER 20 MEQ PO TBCR
40.0000 meq | EXTENDED_RELEASE_TABLET | Freq: Once | ORAL | Status: AC
  Administered 2016-04-07: 40 meq via ORAL
  Filled 2016-04-07: qty 2

## 2016-04-07 MED ORDER — SODIUM CHLORIDE 0.9 % IV BOLUS (SEPSIS)
1000.0000 mL | Freq: Once | INTRAVENOUS | Status: AC
  Administered 2016-04-07: 1000 mL via INTRAVENOUS

## 2016-04-07 NOTE — ED Triage Notes (Signed)
Presents with fatigue and SOB began last month , saw her physician at that time and her HgB was 7, she refused a blood transfusion and decided to take her iron pills. Two weeks ago she had a heavy period with large clots and since then has been feeling worse. Pale mucous membranes.

## 2016-04-07 NOTE — ED Notes (Signed)
PA at bedside.

## 2016-04-07 NOTE — Discharge Instructions (Signed)
Continue taking your iron pills at home as prescribed. I also recommend continuing to drink water at home to remain related. Follow-up with your gynecologist as soon as possible for further management and evaluation of your heavy menstrual periods. I also recommend following up with your primary care provider within the next week for follow-up regarding urine anemia. Please return to the Emergency Department if symptoms worsen or new onset of fever, headache, visual changes, chest pain, difficulty breathing, abdominal pain, vomiting, unable to keep fluids down, syncope, seizure, blood in urine or stool.

## 2016-04-07 NOTE — ED Provider Notes (Signed)
[facility] Provider Note   CSN: [id number] Arrival date & time: 04/07/16  1126     History   Chief Complaint Chief Complaint  Patient presents with  . Fatigue  . Shortness of Breath    HPI Julie Novak is a 35 y.o. female.  Patient is a 35 year old female with history of anemia who presents the ED with complaint of fatigue, onset 2 weeks. Patient reports history of anemia and notes she takes iron pills at home. She states 2 weeks ago she had her menstrual cycle that lasted 5-6 days. She notes her period was heavier than normal and endorses passing multiple clots. She states since finishing her period last week she has felt more fatigued and weak. Endorses associated mild shortness of breath, mild intermittent lightheadedness that occurs with standing and generalized body aches. Patient notes she was seen by her primary care provider last month and was noted to have a hemoglobin of 7. Patient states her PCP recommended having a blood transfusion but she refused and wanted to continue taking her iron pills. Patient reports history of anemia and notes her hemoglobin typically is around 9. Denies fever, headache, visual changes, nasal congestion, rhinorrhea, sore throat, cough, wheezing, chest pain, abdominal pain, nausea, vomiting, diarrhea, urinary symptoms, blood in urine or stool, vaginal bleeding, syncope.      Past Medical History:  Diagnosis Date  . Anemia   . 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   Past Surgical History:  Procedure Laterality Date  . ABDOMINAL SURGERY     ovary tumor    OB History    No data available       Home Medications    Prior to Admission medications   Medication Sig Start Date End Date Taking? Authorizing Provider  benzonatate (TESSALON) 100 MG capsule Take 2 capsules (200 mg total) by mouth 2 (two) times daily as needed for cough. 06/05/15   Hannah  Muthersbaugh, PA-C  ibuprofen (ADVIL,MOTRIN) 800 MG tablet Take 1 tablet (800 mg total) by mouth 3 (three) times daily. 06/05/15   Hannah Muthersbaugh, PA-C  mupirocin ointment (BACTROBAN) 2 % Apply to affected area twice daily. 12/18/15   [place], PA-C    Family History No family history on file.  Social History Social History  Substance Use Topics  . Smoking status: Current Some Day Smoker    Types: Cigarettes  . Smokeless tobacco: Not on file  . Alcohol use Yes     Allergies   Review of patient's allergies indicates no known allergies.   Review of Systems Review of Systems  Constitutional: Positive for fatigue.  Respiratory: Positive for shortness of breath.   Musculoskeletal: Positive for myalgias (generalized).  Neurological: Positive for weakness and light-headedness (with standing).  All other systems reviewed and are negative.    Physical Exam Updated Vital Signs BP 117/83   Pulse 68   Temp 97.7 F (36.5 C) (Oral)   Resp 11   Ht 5' (1.524 m)   Wt 44.5 kg   LMP 03/28/2016 (Approximate)   SpO2 100%   BMI 19.14 kg/m   Physical Exam  Constitutional: She is oriented to person, place, and time. She appears well-developed and well-nourished. No distress.  HENT:  Head: Normocephalic and atraumatic.  Mouth/Throat: Uvula is midline, oropharynx is clear and moist and mucous membranes are normal. No oropharyngeal exudate, posterior oropharyngeal edema, posterior  oropharyngeal erythema or tonsillar abscesses. No tonsillar exudate.  Eyes: EOM are normal. Pupils are equal, round, and reactive to light. Right eye exhibits no discharge. Left eye exhibits no discharge. No scleral icterus.  Pale conjunctiva  Neck: Normal range of motion. Neck supple.  Cardiovascular: Normal rate, regular rhythm, normal heart sounds and intact distal pulses.   Pulmonary/Chest: Effort normal and breath sounds normal. No respiratory distress. She has no wheezes. She has no rales. She  exhibits no tenderness.  Abdominal: Soft. Bowel sounds are normal. She exhibits no distension and no mass. There is no tenderness. There is no rebound and no guarding. No hernia.  Musculoskeletal: Normal range of motion. She exhibits no edema.  Neurological: She is alert and oriented to person, place, and time.  Skin: Skin is warm and dry. She is not diaphoretic.  Nursing note and vitals reviewed.    ED Treatments / Results  Labs (all labs ordered are listed, but only abnormal results are displayed) Labs Reviewed  BASIC METABOLIC PANEL - Abnormal; Notable for the following:       Result Value   Potassium 3.3 (*)    All other components within normal limits  CBC - Abnormal; Notable for the following:    Hemoglobin 8.6 (*)    HCT 27.3 (*)    MCV 64.2 (*)    MCH 20.2 (*)    RDW 19.0 (*)    All other components within normal limits  URINALYSIS, ROUTINE W REFLEX MICROSCOPIC (NOT AT [facility]) - Abnormal; Notable for the following:    APPearance CLOUDY (*)    Hgb urine dipstick SMALL (*)    Nitrite POSITIVE (*)    Leukocytes, UA MODERATE (*)    All other components within normal limits  URINE MICROSCOPIC-ADD ON - Abnormal; Notable for the following:    Squamous Epithelial / LPF 6-30 (*)    Bacteria, UA MANY (*)    All other components within normal limits  PREGNANCY, URINE    EKG  EKG Interpretation None       Radiology No results found.  Procedures Procedures (including critical care time)  Medications Ordered in ED Medications  sodium chloride 0.9 % bolus 1,000 mL (0 mLs Intravenous Stopped 04/07/16 1359)  potassium chloride SA (K-DUR,KLOR-CON) CR tablet 40 mEq (40 mEq Oral Given 04/07/16 1300)     Initial Impression / Assessment and Plan / ED Course  I have reviewed the triage vital signs and the nursing notes.  Pertinent labs & imaging results that were available during my care of the patient were reviewed by me and considered in my medical decision making (see  chart for details).  Clinical Course    Pt presents with fatigue, generalized weakness, intermittent SOB, intermittent lightheadedness for the past 2 weeks. Hx of anemia. Reports having heavier menstrual cycle 2 weeks ago and sxs worsened s/p period. Denies any blood in urine or stool. VSS. Exam revealed pale conjunctiva, lungs CTAB, no abdominal tenderness, RRR, remaining otherwise unremarkable. Pt given IVF.   Hgb 8.7. Chart review showed pt's hgb ranging from 8.6-10.6. Potassium 3.3, pt given PO potassium supplement in the ED. On reevaluation pt is resting comfortable and denies any pain or complaints at this time. She reports feeling better s/p IVF. Discussed results and plan for d/c. Pt has remain hemodynamically stable in the ED. Plan to d/c pt home and advised to continue taking her iron pills as prescribed at home. Advised pt to follow up with gynecologist regarding heavy menstrual  cycles and advised pt to follow up with PCP in the next week to have her hgb rechecked. Discussed return precautions with pt.   Final Clinical Impressions(s) / ED Diagnoses   Final diagnoses:  Anemia, unspecified anemia type    New Prescriptions Discharge Medication List as of 04/07/2016  3:42 PM       Nona Dell, PA-C 04/08/16 1153    Sherwood Gambler, MD 04/09/16 [phone]

## 2016-08-28 ENCOUNTER — Emergency Department (HOSPITAL_BASED_OUTPATIENT_CLINIC_OR_DEPARTMENT_OTHER)
Admission: EM | Admit: 2016-08-28 | Discharge: 2016-08-28 | Disposition: A | Discharge status: Home / Self Care | Payer: BLUE CROSS/BLUE SHIELD | Attending: Emergency Medicine | Admitting: Emergency Medicine

## 2016-08-28 ENCOUNTER — Encounter (HOSPITAL_BASED_OUTPATIENT_CLINIC_OR_DEPARTMENT_OTHER): Payer: Self-pay | Admitting: Emergency Medicine

## 2016-08-28 DIAGNOSIS — J069 Acute upper respiratory infection, unspecified: Secondary | ICD-10-CM | POA: Diagnosis not present

## 2016-08-28 DIAGNOSIS — F17210 Nicotine dependence, cigarettes, uncomplicated: Secondary | ICD-10-CM | POA: Insufficient documentation

## 2016-08-28 DIAGNOSIS — B9789 Other viral agents as the cause of diseases classified elsewhere: Secondary | ICD-10-CM

## 2016-08-28 DIAGNOSIS — R05 Cough: Secondary | ICD-10-CM | POA: Diagnosis present

## 2016-08-28 HISTORY — DX: Encounter for other specified aftercare: Z51.89

## 2016-08-28 MED ORDER — BENZONATATE 100 MG PO CAPS: 100.0000 mg | ORAL_CAPSULE | Freq: Three times a day (TID) | ORAL | 0 refills | Status: AC

## 2016-08-28 MED ORDER — IBUPROFEN 800 MG PO TABS: 800.0000 mg | ORAL_TABLET | Freq: Three times a day (TID) | ORAL | 0 refills | Status: AC

## 2016-08-28 NOTE — Discharge Instructions (Signed)
Take the prescribed medication as directed.  Make sure to rest and drink fluids. Follow-up with your primary care doctor. Return to the ED for new or worsening symptoms. 

## 2016-08-28 NOTE — ED Triage Notes (Signed)
Pt c/o cough, chills since Mon; denies fever

## 2016-08-28 NOTE — ED Provider Notes (Signed)
[facility] Provider Note   CSN: [id number] Arrival date & time: 08/28/16  [id number]     History   Chief Complaint Chief Complaint  Patient presents with  . Cough    HPI Julie Novak is a 36 y.o. female.  The history is provided by the patient and medical records.  Cough  Associated symptoms include chills and sore throat.    36 y.o. F with hx of anemia, ovarian tumor s/p resection, presenting to the ED for cough, body aches, sore throat, and decreased appetite for the past 4 days.  She denies any fever but does endorse chills. She reports cough is intermittently productive with clear sputum. She denies any sick contacts. She did not receive a flu vaccine this year. States she has been taking TheraFlu and cough drops at home with some improvement of her symptoms. She did not receive a flu vaccine this year.  Past Medical History:  Diagnosis Date  . Anemia   . Blood transfusion without reported diagnosis   . Granular cell tumor   . Granulosa cell tumor of ovary (HCC)     There are no active problems to display for this patient.   Past Surgical History:  Procedure Laterality Date  . ABDOMINAL SURGERY     ovary tumor    OB History    No data available       Home Medications    Prior to Admission medications   Medication Sig Start Date End Date Taking? Authorizing Provider  benzonatate (TESSALON) 100 MG capsule Take 2 capsules (200 mg total) by mouth 2 (two) times daily as needed for cough. 06/05/15   Hannah Muthersbaugh, PA-C  ibuprofen (ADVIL,MOTRIN) 800 MG tablet Take 1 tablet (800 mg total) by mouth 3 (three) times daily. 06/05/15   Hannah Muthersbaugh, PA-C  mupirocin ointment (BACTROBAN) 2 % Apply to affected area twice daily. 12/18/15   [place], PA-C    Family History History reviewed. No pertinent family history.  Social History Social History  Substance Use Topics  . Smoking status: Current Some Day Smoker    Types: Cigarettes  .  Smokeless tobacco: Never Used  . Alcohol use Yes     Allergies   Patient has no known allergies.   Review of Systems Review of Systems  Constitutional: Positive for appetite change and chills.  HENT: Positive for sore throat.   Respiratory: Positive for cough.   All other systems reviewed and are negative.    Physical Exam Updated Vital Signs BP 99/73 (BP Location: Left Arm)   Pulse 88   Temp 98.5 F (36.9 C) (Oral)   Resp 18   Ht 5' (1.524 m)   Wt 43.1 kg   SpO2 100%   BMI 18.55 kg/m   Physical Exam  Constitutional: She is oriented to person, place, and time. She appears well-developed and well-nourished.  HENT:  Head: Normocephalic and atraumatic.  Right Ear: Tympanic membrane and ear canal normal.  Left Ear: Tympanic membrane and ear canal normal.  Nose: Nose normal.  Mouth/Throat: Uvula is midline, oropharynx is clear and moist and mucous membranes are normal. No oropharyngeal exudate, posterior oropharyngeal edema, posterior oropharyngeal erythema or tonsillar abscesses.  Eyes: Conjunctivae and EOM are normal. Pupils are equal, round, and reactive to light.  Neck: Normal range of motion.  Cardiovascular: Normal rate, regular rhythm and normal heart sounds.   Pulmonary/Chest: Effort normal and breath sounds normal. She has no decreased breath sounds. She has no wheezes. She  has no rhonchi. She has no rales.  Abdominal: Soft. Bowel sounds are normal.  Musculoskeletal: Normal range of motion.  Neurological: She is alert and oriented to person, place, and time.  Skin: Skin is warm and dry.  Psychiatric: She has a normal mood and affect.  Nursing note and vitals reviewed.    ED Treatments / Results  Labs (all labs ordered are listed, but only abnormal results are displayed) Labs Reviewed - No data to display  EKG  EKG Interpretation None       Radiology No results found.  Procedures Procedures (including critical care time)  Medications Ordered in  ED Medications - No data to display   Initial Impression / Assessment and Plan / ED Course  I have reviewed the triage vital signs and the nursing notes.  Pertinent labs & imaging results that were available during my care of the patient were reviewed by me and considered in my medical decision making (see chart for details).  36 year old female here with URI type symptoms for the past 4 days. She is afebrile and nontoxic. Her exam is overall benign. Her vitals are stable. Suspect viral process. Will discharge home with symptomatic treatment.  Discussed plan with patient, she acknowledged understanding and agreed with plan of care.  Return precautions given for new or worsening symptoms.  Final Clinical Impressions(s) / ED Diagnoses   Final diagnoses:  Viral URI with cough    New Prescriptions Discharge Medication List as of 08/28/2016 10:09 AM       Larene Pickett, PA-C 08/28/16 [place], MD 08/28/16 1235

## 2017-09-24 ENCOUNTER — Emergency Department (HOSPITAL_BASED_OUTPATIENT_CLINIC_OR_DEPARTMENT_OTHER)
Admission: EM | Admit: 2017-09-24 | Discharge: 2017-09-24 | Disposition: A | Discharge status: Home / Self Care | Payer: BLUE CROSS/BLUE SHIELD | Attending: Emergency Medicine | Admitting: Emergency Medicine

## 2017-09-24 ENCOUNTER — Emergency Department (HOSPITAL_BASED_OUTPATIENT_CLINIC_OR_DEPARTMENT_OTHER): Payer: BLUE CROSS/BLUE SHIELD

## 2017-09-24 ENCOUNTER — Encounter (HOSPITAL_BASED_OUTPATIENT_CLINIC_OR_DEPARTMENT_OTHER): Payer: Self-pay | Admitting: Emergency Medicine

## 2017-09-24 ENCOUNTER — Other Ambulatory Visit: Payer: Self-pay

## 2017-09-24 DIAGNOSIS — E876 Hypokalemia: Secondary | ICD-10-CM | POA: Insufficient documentation

## 2017-09-24 DIAGNOSIS — D259 Leiomyoma of uterus, unspecified: Secondary | ICD-10-CM | POA: Insufficient documentation

## 2017-09-24 DIAGNOSIS — Z79899 Other long term (current) drug therapy: Secondary | ICD-10-CM | POA: Diagnosis not present

## 2017-09-24 DIAGNOSIS — F17210 Nicotine dependence, cigarettes, uncomplicated: Secondary | ICD-10-CM | POA: Insufficient documentation

## 2017-09-24 DIAGNOSIS — N83201 Unspecified ovarian cyst, right side: Secondary | ICD-10-CM | POA: Diagnosis not present

## 2017-09-24 DIAGNOSIS — R1084 Generalized abdominal pain: Secondary | ICD-10-CM | POA: Diagnosis present

## 2017-09-24 LAB — CBC
HEMATOCRIT: 34.1 % — AB (ref 36.0–46.0)
Hemoglobin: 11.3 g/dL — ABNORMAL LOW (ref 12.0–15.0)
MCH: 22.6 pg — ABNORMAL LOW (ref 26.0–34.0)
MCHC: 33.1 g/dL (ref 30.0–36.0)
MCV: 68.3 fL — AB (ref 78.0–100.0)
PLATELETS: 250 10*3/uL (ref 150–400)
RBC: 4.99 MIL/uL (ref 3.87–5.11)
RDW: 14.7 % (ref 11.5–15.5)
WBC: 4.9 10*3/uL (ref 4.0–10.5)

## 2017-09-24 LAB — URINALYSIS, ROUTINE W REFLEX MICROSCOPIC
BILIRUBIN URINE: NEGATIVE
GLUCOSE, UA: NEGATIVE mg/dL
KETONES UR: NEGATIVE mg/dL
Nitrite: NEGATIVE
PH: 6.5 (ref 5.0–8.0)
Protein, ur: NEGATIVE mg/dL
Specific Gravity, Urine: 1.01 (ref 1.005–1.030)

## 2017-09-24 LAB — COMPREHENSIVE METABOLIC PANEL
ALBUMIN: 4.3 g/dL (ref 3.5–5.0)
ALT: 32 U/L (ref 14–54)
AST: 23 U/L (ref 15–41)
Alkaline Phosphatase: 68 U/L (ref 38–126)
Anion gap: 9 (ref 5–15)
BILIRUBIN TOTAL: 0.6 mg/dL (ref 0.3–1.2)
BUN: 8 mg/dL (ref 6–20)
CHLORIDE: 102 mmol/L (ref 101–111)
CO2: 26 mmol/L (ref 22–32)
CREATININE: 0.52 mg/dL (ref 0.44–1.00)
Calcium: 9.4 mg/dL (ref 8.9–10.3)
GFR calc Af Amer: >60 mL/min (ref >60)
GLUCOSE: 120 mg/dL — AB (ref 65–99)
POTASSIUM: 3.4 mmol/L — AB (ref 3.5–5.1)
Sodium: 137 mmol/L (ref 135–145)
Total Protein: 8.1 g/dL (ref 6.5–8.1)

## 2017-09-24 LAB — URINALYSIS, MICROSCOPIC (REFLEX)

## 2017-09-24 LAB — PREGNANCY, URINE: Preg Test, Ur: NEGATIVE

## 2017-09-24 LAB — LIPASE, BLOOD: LIPASE: 30 U/L (ref 11–51)

## 2017-09-24 MED ORDER — KETOROLAC TROMETHAMINE 15 MG/ML IJ SOLN: 15.0000 mg | Freq: Once | INTRAMUSCULAR | Status: DC

## 2017-09-24 MED ORDER — KETOROLAC TROMETHAMINE 15 MG/ML IJ SOLN
15.0000 mg | Freq: Once | INTRAMUSCULAR | Status: AC
  Administered 2017-09-24: 15 mg via INTRAMUSCULAR
  Filled 2017-09-24: qty 1

## 2017-09-24 MED ORDER — POTASSIUM CHLORIDE ER 10 MEQ PO TBCR: 10.0000 meq | EXTENDED_RELEASE_TABLET | Freq: Two times a day (BID) | ORAL | 0 refills | Status: AC

## 2017-09-24 MED ORDER — ONDANSETRON HCL 4 MG PO TABS: 4.0000 mg | ORAL_TABLET | Freq: Four times a day (QID) | ORAL | 0 refills | Status: DC

## 2017-09-24 MED FILL — ONDANSETRON HCL 4 MG TABLET: 4 | 3 days supply | Qty: 12 | Fill #0

## 2017-09-24 MED FILL — POTASSIUM CHLORIDE 10MEQ ER: 10 | 3 days supply | Qty: 6 | Fill #0

## 2017-09-24 NOTE — ED Provider Notes (Signed)
[facility] EMERGENCY DEPARTMENT Provider Note   CSN: [id number] Arrival date & time: 09/24/17  1027     History   Chief Complaint Chief Complaint  Patient presents with  . Flank Pain    HPI Julie Novak is a 37 y.o. female who presents to the emergency department with a chief complaint of right flank pain that has been "on and off for some time", but became constant 2 days ago.  Pain is characterized as sharp and achy.  She reports that the pain has remained constant and began to radiate to her right groin this A.M. No known aggravating or alleviating factors.  She reports associated loose stools and nausea. She denies fever, chills, vomiting, hematuria, dysuria, frequency, vaginal pain or discharge, chest pain, dyspnea, melena, or hematochezia.  No history of similar pain.  No history of abdominal surgery.   She was seen at urgent care 2 days ago after the pain became constant and was discharged with tramadol, Flomax, for suspected nephrolithiasis and encouraged to return to the emergency department if symptoms do not improve.  She states that she has been taking the tramadol at home, but it is just made her sleepy and has not improved her symptoms.  No personal history of nephrolithiasis or family hx of nephrolithiasis.   The history is provided by the patient. No language interpreter was used.  Flank Pain  Associated symptoms include abdominal pain. Pertinent negatives include no chest pain and no shortness of breath.    Past Medical History:  Diagnosis Date  . Anemia   . Blood transfusion without reported diagnosis   . 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   Past Surgical History:  Procedure Laterality Date  . ABDOMINAL SURGERY     ovary tumor    OB History    No data available       Home Medications    Prior to Admission medications   Medication Sig Start Date End Date  Taking? Authorizing Provider  amoxicillin-clavulanate (AUGMENTIN) 875-125 MG tablet Take 1 tablet by mouth 2 (two) times daily.   Yes [provider]  tamsulosin (FLOMAX) 0.4 MG CAPS capsule Take 0.4 mg by mouth daily.   Yes [provider]  traMADol (ULTRAM) 50 MG tablet Take by mouth every 6 (six) hours as needed.   Yes [provider]  benzonatate (TESSALON) 100 MG capsule Take 1 capsule (100 mg total) by mouth every 8 (eight) hours. 08/28/16   Larene Pickett, PA-C  ibuprofen (ADVIL,MOTRIN) 800 MG tablet Take 1 tablet (800 mg total) by mouth 3 (three) times daily. 08/28/16   Larene Pickett, PA-C  mupirocin ointment (BACTROBAN) 2 % Apply to affected area twice daily. 12/18/15   Ward, Ozella Almond, PA-C  ondansetron (ZOFRAN) 4 MG tablet Take 1 tablet (4 mg total) by mouth every 6 (six) hours. 09/24/17   McDonald, Mia A, PA-C  potassium chloride (K-DUR) 10 MEQ tablet Take 1 tablet (10 mEq total) by mouth 2 (two) times daily for 3 days. 09/24/17 09/27/17  McDonald, Maree Erie A, PA-C    Family History No family history on file.  Social History Social History   Tobacco Use  . Smoking status: Current Some Day Smoker    Types: Cigarettes  . Smokeless tobacco: Never Used  Substance Use Topics  . Alcohol use: Yes  . Drug use: No  Allergies   Patient has no known allergies.   Review of Systems Review of Systems  Constitutional: Negative for activity change and chills.  Respiratory: Negative for shortness of breath.   Cardiovascular: Negative for chest pain.  Gastrointestinal: Positive for abdominal pain, diarrhea and nausea. Negative for anal bleeding, blood in stool and vomiting.  Genitourinary: Positive for flank pain. Negative for dysuria, hematuria and vaginal bleeding.  Musculoskeletal: Negative for back pain, myalgias, neck pain and neck stiffness.  Skin: Negative for rash.  Allergic/Immunologic: Negative for immunocompromised state.  Neurological: Negative  for dizziness and weakness.   Physical Exam Updated Vital Signs BP 128/85 (BP Location: Right Arm)   Pulse 88   Temp 98.3 F (36.8 C)   Resp 18   Ht 5' (1.524 m)   Wt 53.5 kg (118 lb)   SpO2 100%   BMI 23.05 kg/m   Physical Exam  Constitutional: No distress.  HENT:  Head: Normocephalic.  Eyes: Conjunctivae are normal.  Neck: Neck supple.  Cardiovascular: Normal rate, regular rhythm, normal heart sounds and intact distal pulses. Exam reveals no gallop and no friction rub.  No murmur heard. Pulmonary/Chest: Effort normal and breath sounds normal. No stridor. No respiratory distress. She has no wheezes. She has no rales. She exhibits no tenderness.  Abdominal: Soft. She exhibits no distension and no mass. There is tenderness. There is no rebound and no guarding. No hernia.  Tender to palpation to the right flank.  Minimal tenderness to the right lower quadrant and right upper quadrant.  No tenderness over McBurney's point.  Negative Murphy sign.  No left upper or lower quadrant tenderness.  No rebound or guarding or peritoneal signs.  No CVA tenderness bilaterally.   Musculoskeletal: Normal range of motion. She exhibits no edema, tenderness or deformity.  Neurological: She is alert.  Skin: Skin is warm. Capillary refill takes less than 2 seconds. No rash noted.  Psychiatric: Her behavior is normal.  Nursing note and vitals reviewed.  ED Treatments / Results  Labs (all labs ordered are listed, but only abnormal results are displayed) Labs Reviewed  URINALYSIS, ROUTINE W REFLEX MICROSCOPIC - Abnormal; Notable for the following components:      Result Value   APPearance HAZY (*)    Hgb urine dipstick MODERATE (*)    Leukocytes, UA SMALL (*)    All other components within normal limits  URINALYSIS, MICROSCOPIC (REFLEX) - Abnormal; Notable for the following components:   Bacteria, UA FEW (*)    Squamous Epithelial / LPF 0-5 (*)    All other components within normal limits  CBC -  Abnormal; Notable for the following components:   Hemoglobin 11.3 (*)    HCT 34.1 (*)    MCV 68.3 (*)    MCH 22.6 (*)    All other components within normal limits  COMPREHENSIVE METABOLIC PANEL - Abnormal; Notable for the following components:   Potassium 3.4 (*)    Glucose, Bld 120 (*)    All other components within normal limits  PREGNANCY, URINE  LIPASE, BLOOD    EKG  EKG Interpretation None       Radiology Ct Renal Stone Study  Result Date: 09/24/2017 CLINICAL DATA:  Acute right flank pain, microscopic hematuria. EXAM: CT ABDOMEN AND PELVIS WITHOUT CONTRAST TECHNIQUE: Multidetector CT imaging of the abdomen and pelvis was performed following the standard protocol without IV contrast. COMPARISON:  None. FINDINGS: Lower chest: No acute abnormality. Hepatobiliary: No focal liver abnormality is seen. No gallstones, gallbladder  wall thickening, or biliary dilatation. Pancreas: Unremarkable. No pancreatic ductal dilatation or surrounding inflammatory changes. Spleen: Normal in size without focal abnormality. Adrenals/Urinary Tract: Adrenal glands are unremarkable. Kidneys are normal, without renal calculi, focal lesion, or hydronephrosis. Bladder is unremarkable. Stomach/Bowel: Stomach is within normal limits. Appendix appears normal. No evidence of bowel wall thickening, distention, or inflammatory changes. Vascular/Lymphatic: No significant vascular findings are present. No enlarged abdominal or pelvic lymph nodes. Reproductive: 2.9 cm left ovarian cyst is noted. 4.8 cm posterior uterine fibroid is noted. Other: No abdominal wall hernia or abnormality. No abdominopelvic ascites. Musculoskeletal: No acute or significant osseous findings. IMPRESSION: Large posterior uterine fibroid is noted. No other significant abnormality seen in the abdomen or pelvis. Electronically Signed   By: Marijo Conception, M.D.   On: 09/24/2017 13:34    Procedures Procedures (including critical care  time)  Medications Ordered in ED Medications  ketorolac (TORADOL) 15 MG/ML injection 15 mg (15 mg Intramuscular Given 09/24/17 1358)     Initial Impression / Assessment and Plan / ED Course  I have reviewed the triage vital signs and the nursing notes.  Pertinent labs & imaging results that were available during my care of the patient were reviewed by me and considered in my medical decision making (see chart for details).     Patient is nontoxic, nonseptic appearing, in no apparent distress.  Patient's pain and other symptoms adequately managed in emergency department.   Labs, imaging and vitals reviewed.  CT abdomen pelvis with 2.9 cm left ovarian cyst and 4.8 cm posterior uterine fibroid, but otherwise unremarkable. Patient does not meet the SIRS or Sepsis criteria.  On repeat exam patient does not have a surgical abdomen and there are no peritoneal signs.  No indication of appendicitis, bowel obstruction, bowel perforation, cholecystitis, diverticulitis, PID or ectopic pregnancy.  Discussed imaging findings with the patient.  Uterine fibroid and ovarian cyst has been noticed on previous ultrasound studies, however upon review of the studies it appears that the measurements seem to vary.  Given her acute symptoms, encouraged OB/GYN follow-up Patient discharged home with symptomatic treatment.  Pain improved with Toradol in the ED; recommended Aleve or ibuprofen for pain control at home. I have also discussed reasons to return immediately to the ER.  Patient expresses understanding and agrees with plan.  Final Clinical Impressions(s) / ED Diagnoses   Final diagnoses:  Right ovarian cyst  Uterine leiomyoma, unspecified location  Hypokalemia    ED Discharge Orders        Ordered    ondansetron (ZOFRAN) 4 MG tablet  Every 6 hours     09/24/17 1546    potassium chloride (K-DUR) 10 MEQ tablet  2 times daily     09/24/17 1554       McDonald, Mia A, PA-C 09/24/17 1647    Orlie Dakin, MD 10/03/17 1044

## 2017-09-24 NOTE — Discharge Instructions (Signed)
Please call your OB/GYN to schedule a follow-up appointment from your visit to the emergency department today.  Take 600 mg of ibuprofen with food every 8 hours as needed for pain control or you may take 500 mg of Aleve with food every 12 hours as needed for pain control.  Please do not take both of these medications at the same time as they both are the same type of medication.  Let 1 tablet of Zofran dissolve under your tongue every 8 hours as needed for nausea or vomiting.  Your potassium was slightly low today.  Take 1 tablet of potassium twice daily for the next 3 days.  Please have your urine rechecked within the next month to see if you still have microscopic hemoglobin in it.   If you develop new or worsening symptoms, including persistent vomiting despite taking Zofran, uncontrolled pain, high fever that does not improve with Tylenol, or other new concerning symptoms, please return to the emergency department for reevaluation.

## 2017-09-24 NOTE — ED Notes (Signed)
Pt verbalizes understanding of d/c instructions and denies any further needs at this time. 

## 2017-09-24 NOTE — ED Triage Notes (Signed)
Right sided flank pain for a while, worse on Tuesday. Seen at urgent care and was told she may have kidney stone.  Pt continues to have flank pain.  No N/V or fever.

## 2017-12-30 ENCOUNTER — Ambulatory Visit: Payer: Self-pay

## 2017-12-30 ENCOUNTER — Other Ambulatory Visit: Payer: Self-pay | Admitting: Family Medicine

## 2017-12-30 DIAGNOSIS — M79641 Pain in right hand: Secondary | ICD-10-CM

## 2019-05-15 ENCOUNTER — Emergency Department (HOSPITAL_BASED_OUTPATIENT_CLINIC_OR_DEPARTMENT_OTHER): Payer: BC Managed Care – PPO

## 2019-05-15 ENCOUNTER — Other Ambulatory Visit: Payer: Self-pay

## 2019-05-15 ENCOUNTER — Encounter (HOSPITAL_BASED_OUTPATIENT_CLINIC_OR_DEPARTMENT_OTHER): Payer: Self-pay | Admitting: *Deleted

## 2019-05-15 ENCOUNTER — Emergency Department (HOSPITAL_BASED_OUTPATIENT_CLINIC_OR_DEPARTMENT_OTHER)
Admission: EM | Admit: 2019-05-15 | Discharge: 2019-05-15 | Disposition: A | Discharge status: Home / Self Care | Payer: BC Managed Care – PPO | Attending: Emergency Medicine | Admitting: Emergency Medicine

## 2019-05-15 DIAGNOSIS — R1011 Right upper quadrant pain: Secondary | ICD-10-CM | POA: Diagnosis not present

## 2019-05-15 DIAGNOSIS — Z79899 Other long term (current) drug therapy: Secondary | ICD-10-CM | POA: Insufficient documentation

## 2019-05-15 DIAGNOSIS — R109 Unspecified abdominal pain: Secondary | ICD-10-CM | POA: Diagnosis present

## 2019-05-15 DIAGNOSIS — R1013 Epigastric pain: Secondary | ICD-10-CM | POA: Diagnosis not present

## 2019-05-15 DIAGNOSIS — F17210 Nicotine dependence, cigarettes, uncomplicated: Secondary | ICD-10-CM | POA: Diagnosis not present

## 2019-05-15 LAB — URINALYSIS, ROUTINE W REFLEX MICROSCOPIC
Bilirubin Urine: NEGATIVE
Glucose, UA: NEGATIVE mg/dL
Ketones, ur: NEGATIVE mg/dL
Nitrite: POSITIVE — AB
Protein, ur: NEGATIVE mg/dL
Specific Gravity, Urine: 1.015 (ref 1.005–1.030)
pH: 7 (ref 5.0–8.0)

## 2019-05-15 LAB — CBC
HCT: 41.9 % (ref 36.0–46.0)
Hemoglobin: 13 g/dL (ref 12.0–15.0)
MCH: 22.7 pg — ABNORMAL LOW (ref 26.0–34.0)
MCHC: 31 g/dL (ref 30.0–36.0)
MCV: 73.1 fL — ABNORMAL LOW (ref 80.0–100.0)
Platelets: 321 10*3/uL (ref 150–400)
RBC: 5.73 MIL/uL — ABNORMAL HIGH (ref 3.87–5.11)
RDW: 14.5 % (ref 11.5–15.5)
WBC: 6.1 10*3/uL (ref 4.0–10.5)
nRBC: 0 % (ref 0.0–0.2)

## 2019-05-15 LAB — COMPREHENSIVE METABOLIC PANEL
ALT: 19 U/L (ref 0–44)
AST: 19 U/L (ref 15–41)
Albumin: 4.3 g/dL (ref 3.5–5.0)
Alkaline Phosphatase: 78 U/L (ref 38–126)
Anion gap: 11 (ref 5–15)
BUN: 7 mg/dL (ref 6–20)
CO2: 24 mmol/L (ref 22–32)
Calcium: 9.5 mg/dL (ref 8.9–10.3)
Chloride: 104 mmol/L (ref 98–111)
Creatinine, Ser: 0.58 mg/dL (ref 0.44–1.00)
GFR calc Af Amer: >60 mL/min (ref >60)
GFR calc non Af Amer: >60 mL/min (ref >60)
Glucose, Bld: 99 mg/dL (ref 70–99)
Potassium: 3.6 mmol/L (ref 3.5–5.1)
Sodium: 139 mmol/L (ref 135–145)
Total Bilirubin: 1 mg/dL (ref 0.3–1.2)
Total Protein: 8.3 g/dL — ABNORMAL HIGH (ref 6.5–8.1)

## 2019-05-15 LAB — LIPASE, BLOOD: Lipase: 86 U/L — ABNORMAL HIGH (ref 11–51)

## 2019-05-15 LAB — URINALYSIS, MICROSCOPIC (REFLEX)

## 2019-05-15 LAB — PREGNANCY, URINE: Preg Test, Ur: NEGATIVE

## 2019-05-15 MED ORDER — SODIUM CHLORIDE 0.9% FLUSH
3.0000 mL | Freq: Once | INTRAVENOUS | Status: DC
  Filled 2019-05-15: qty 3

## 2019-05-15 MED ORDER — KETOROLAC TROMETHAMINE 30 MG/ML IJ SOLN
30.0000 mg | Freq: Once | INTRAMUSCULAR | Status: AC
  Administered 2019-05-15: 15:00:00 30 mg via INTRAVENOUS
  Filled 2019-05-15: qty 1

## 2019-05-15 MED ORDER — FAMOTIDINE IN NACL 20-0.9 MG/50ML-% IV SOLN
20.0000 mg | Freq: Once | INTRAVENOUS | Status: AC
  Administered 2019-05-15: 20 mg via INTRAVENOUS
  Filled 2019-05-15: qty 50

## 2019-05-15 MED ORDER — OMEPRAZOLE 20 MG PO CPDR: 20.0000 mg | DELAYED_RELEASE_CAPSULE | Freq: Every day | ORAL | 0 refills | Status: AC

## 2019-05-15 NOTE — ED Notes (Signed)
Pt to u/s

## 2019-05-15 NOTE — ED Provider Notes (Signed)
[facility] EMERGENCY DEPARTMENT Provider Note   CSN: [id number] Arrival date & time: 05/15/19  1213     History   Chief Complaint Chief Complaint  Patient presents with   Abdominal Pain    HPI Julie Novak is a 38 y.o. female.  She has a prior history of ovarian tumor status post resection.  She is complaining of abdominal pain that started yesterday upper abdomen radiating through to the back 7 out of 10 intensity.  No prior history of same.  No fevers or chills vomiting or diarrhea.  No urinary symptoms.  No cough or shortness of breath.     The history is provided by the patient.  Abdominal Pain Pain location:  Epigastric and RUQ Pain quality: aching   Pain radiates to:  Back Pain severity now: 7/10. Onset quality:  Gradual Duration:  2 days Timing:  Constant Progression:  Unchanged Chronicity:  New Context: not recent illness, not recent travel and not trauma   Relieved by:  None tried Worsened by:  Nothing Ineffective treatments:  None tried Associated symptoms: no chest pain, no constipation, no cough, no diarrhea, no dysuria, no fever, no hematemesis, no hematochezia, no hematuria, no nausea, no shortness of breath, no sore throat, no vaginal bleeding, no vaginal discharge and no vomiting     Past Medical History:  Diagnosis Date   Anemia    Blood transfusion without reported diagnosis    Granular cell tumor    Granulosa cell tumor of ovary     There are no active problems to display for this patient.   Past Surgical History:  Procedure Laterality Date   ABDOMINAL SURGERY     ovary tumor     OB History   No obstetric history on file.      Home Medications    Prior to Admission medications   Medication Sig Start Date End Date Taking? Authorizing Provider  amoxicillin-clavulanate (AUGMENTIN) 875-125 MG tablet Take 1 tablet by mouth 2 (two) times daily.    [provider]  benzonatate (TESSALON) 100 MG capsule Take 1  capsule (100 mg total) by mouth every 8 (eight) hours. 08/28/16   Larene Pickett, PA-C  ibuprofen (ADVIL,MOTRIN) 800 MG tablet Take 1 tablet (800 mg total) by mouth 3 (three) times daily. 08/28/16   Larene Pickett, PA-C  mupirocin ointment (BACTROBAN) 2 % Apply to affected area twice daily. 12/18/15   Ward, Ozella Almond, PA-C  ondansetron (ZOFRAN) 4 MG tablet Take 1 tablet (4 mg total) by mouth every 6 (six) hours. 09/24/17   McDonald, Mia A, PA-C  potassium chloride (K-DUR) 10 MEQ tablet Take 1 tablet (10 mEq total) by mouth 2 (two) times daily for 3 days. 09/24/17 09/27/17  McDonald, Mia A, PA-C  tamsulosin (FLOMAX) 0.4 MG CAPS capsule Take 0.4 mg by mouth daily.    [provider]  traMADol (ULTRAM) 50 MG tablet Take by mouth every 6 (six) hours as needed.    [provider]    Family History History reviewed. No pertinent family history.  Social History Social History   Tobacco Use   Smoking status: Current Some Day Smoker    Types: Cigarettes   Smokeless tobacco: Never Used  Substance Use Topics   Alcohol use: Yes   Drug use: No     Allergies   Patient has no known allergies.   Review of Systems Review of Systems  Constitutional: Negative for fever.  HENT: Negative for sore throat.  Eyes: Negative for visual disturbance.  Respiratory: Negative for cough and shortness of breath.   Cardiovascular: Negative for chest pain.  Gastrointestinal: Positive for abdominal pain. Negative for constipation, diarrhea, hematemesis, hematochezia, nausea and vomiting.  Genitourinary: Negative for dysuria, hematuria, vaginal bleeding and vaginal discharge.  Musculoskeletal: Positive for back pain.  Skin: Negative for rash.  Neurological: Negative for headaches.     Physical Exam Updated Vital Signs BP 117/85 (BP Location: Left Arm)    Pulse 97    Temp 98.9 F (37.2 C) (Oral)    Resp 18    Ht 5' (1.524 m)    Wt 53.5 kg    SpO2 100%    BMI 23.05 kg/m   Physical  Exam Vitals signs and nursing note reviewed.  Constitutional:      General: She is not in acute distress.    Appearance: She is well-developed.  HENT:     Head: Normocephalic and atraumatic.  Eyes:     Conjunctiva/sclera: Conjunctivae normal.  Neck:     Musculoskeletal: Neck supple.  Cardiovascular:     Rate and Rhythm: Normal rate and regular rhythm.     Heart sounds: No murmur.  Pulmonary:     Effort: Pulmonary effort is normal. No respiratory distress.     Breath sounds: Normal breath sounds.  Abdominal:     Palpations: Abdomen is soft.     Tenderness: There is abdominal tenderness in the right upper quadrant and epigastric area. There is no guarding or rebound.  Musculoskeletal: Normal range of motion.     Right lower leg: No edema.     Left lower leg: No edema.  Skin:    General: Skin is warm and dry.     Capillary Refill: Capillary refill takes less than 2 seconds.  Neurological:     General: No focal deficit present.     Mental Status: She is alert.      ED Treatments / Results  Labs (all labs ordered are listed, but only abnormal results are displayed) Labs Reviewed  LIPASE, BLOOD - Abnormal; Notable for the following components:      Result Value   Lipase 86 (*)    All other components within normal limits  COMPREHENSIVE METABOLIC PANEL - Abnormal; Notable for the following components:   Total Protein 8.3 (*)    All other components within normal limits  CBC - Abnormal; Notable for the following components:   RBC 5.73 (*)    MCV 73.1 (*)    MCH 22.7 (*)    All other components within normal limits  URINALYSIS, ROUTINE W REFLEX MICROSCOPIC - Abnormal; Notable for the following components:   APPearance CLOUDY (*)    Hgb urine dipstick MODERATE (*)    Nitrite POSITIVE (*)    Leukocytes,Ua SMALL (*)    All other components within normal limits  URINALYSIS, MICROSCOPIC (REFLEX) - Abnormal; Notable for the following components:   Bacteria, UA MANY (*)     All other components within normal limits  URINE CULTURE  PREGNANCY, URINE    EKG None  Radiology US Abdomen Limited Ruq  Result Date: 05/15/2019 CLINICAL DATA:  38 year old female with history of epigastric pain and nausea for the past 2 days. EXAM: ULTRASOUND ABDOMEN LIMITED RIGHT UPPER QUADRANT COMPARISON:  No priors. FINDINGS: Gallbladder: No gallstones or wall thickening visualized. No sonographic Murphy sign noted by sonographer. Common bile duct: Diameter: 3 mm Liver: In the right lobe of the liver there is a 2.1 x  1.8 cm echogenic lesion with no posterior acoustic shadowing or enhancement, and no definite internal blood flow. There is a smaller lesion in the right lobe of the liver measuring 1.8 x 1.0 x 1.0 cm with some posterior acoustic enhancement and no internal blood flow. Within normal limits in parenchymal echogenicity. Portal vein is patent on color Doppler imaging with normal direction of blood flow towards the liver. Other: None. IMPRESSION: 1. No acute findings in the abdomen to account for the patient's symptoms. Specifically, no cholelithiasis or findings to suggest an acute cholecystitis at this time. 2. 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 If of clinical concern, these could be definitively characterized with follow-up nonemergent outpatient abdominal MRI with and without IV gadolinium. Electronically Signed   By: Vinnie Langton M.D.   On: 05/15/2019 14:33    Procedures Procedures (including critical care time)  Medications Ordered in ED Medications  sodium chloride flush (NS) 0.9 % injection 3 mL (has no administration in time range)     Initial Impression / Assessment and Plan / ED Course  I have reviewed the triage vital signs and the nursing notes.  Pertinent labs & imaging results that were available during my care of the patient were  reviewed by me and considered in my medical decision making (see chart for details).  Clinical Course as of May 14 1634  Sun May 15, 8863  [id number] 38 year old female with 2 days of epigastric right upper quadrant abdominal pain radiating through to her back.  Differential includes cholelithiasis, cholecystitis, peptic ulcer disease, gastroenteritis, [place].    [MB]  1440 Patient's ultrasound fairly unremarkable other than a hepatic cyst that may need outpatient follow-up.  Her white count is normal LFTs normal.  Lipase is 86 and mildly elevated.  Urine interestingly is nitrite positive but 0-5 whites.  Have sent for culture.  No urinary symptoms so will not put on antibiotics.  And go to give her a dose of Pepcid and send her out on a PPI to follow-up with her PCP.   [MB]    Clinical Course User Index [MB] Hayden Rasmussen, MD   Julie Novak was evaluated in Emergency Department on 05/15/2019 for the symptoms described in the history of present illness. She was evaluated in the context of the global COVID-19 pandemic, which necessitated consideration that the patient might be at risk for infection with the SARS-CoV-2 virus that causes COVID-19. Institutional protocols and algorithms that pertain to the evaluation of patients at risk for COVID-19 are in a state of rapid change based on information released by regulatory bodies including the CDC and federal and state organizations. These policies and algorithms were followed during the patient's care in the ED.      Final Clinical Impressions(s) / ED Diagnoses   Final diagnoses:  RUQ abdominal pain  Epigastric pain    ED Discharge Orders         Ordered    omeprazole (PRILOSEC) 20 MG capsule  Daily     05/15/19 1442           Hayden Rasmussen, MD 05/15/19 1636

## 2019-05-15 NOTE — ED Notes (Signed)
Unsuccessful IV stick x 1. Pt tolerated well.

## 2019-05-15 NOTE — ED Triage Notes (Addendum)
Epigastric pain radiating into back x 2 days since eating [employer]. Denies vomiting or diarrhea. Denies fever.

## 2019-05-15 NOTE — ED Notes (Signed)
EDP at bedside to further explain findings and d/c instructions.

## 2019-05-15 NOTE — Discharge Instructions (Signed)
You were seen in the emergency department for upper abdominal pain.  You had blood work and an ultrasound that did not show any signs of gallstones.  We are putting you on some acid medication because this may be related to gastritis.  It will be important for you to follow-up with your primary care doctor as you may require further testing.  A copy of the ultrasound report is included below.  Please return to the emergency department if any worsening symptoms.  IMPRESSION:  1. No acute findings in the abdomen to account for the patient's  symptoms. Specifically, no cholelithiasis or findings to suggest an  acute cholecystitis at this time.  2. 2 echogenic lesions in the right lobe of the liver, incompletely  characterized on today's examination, but favored to represent small  cavernous hemangiomas. If of clinical concern, these could be  definitively characterized with follow-up nonemergent outpatient  abdominal MRI with and without IV gadolinium.

## 2019-05-15 NOTE — ED Notes (Signed)
Dr Butler at bedside.  

## 2019-05-17 LAB — URINE CULTURE: Culture: >=100000 — AB

## 2019-05-18 ENCOUNTER — Telehealth: Payer: Self-pay

## 2019-05-18 NOTE — Progress Notes (Signed)
ED Antimicrobial Stewardship Positive Culture Follow Up   Julie Novak is an 38 y.o. female who presented to [facility] on 05/15/2019 with a chief complaint of  Chief Complaint  Patient presents with  . Abdominal Pain    Recent Results (from the past 720 hour(s))  Urine culture     Status: Abnormal   Collection Time: 05/15/19 12:23 PM   Specimen: Urine, Random  Result Value Ref Range Status   Specimen Description   Final    URINE, RANDOM Performed at [facility], [address] [place], [place] [address]    Special Requests   Final    NONE Performed at [facility], [address] [place], [place] [address]    Culture >=100,000 COLONIES/mL ESCHERICHIA COLI (A)  Final   Report Status 05/17/2019 FINAL  Final   Organism ID, Bacteria ESCHERICHIA COLI (A)  Final      Susceptibility   Escherichia coli - MIC*    AMPICILLIN 4 SENSITIVE Sensitive     CEFAZOLIN <=4 SENSITIVE Sensitive     CEFTRIAXONE <=1 SENSITIVE Sensitive     CIPROFLOXACIN <=0.25 SENSITIVE Sensitive     GENTAMICIN <=1 SENSITIVE Sensitive     IMIPENEM <=0.25 SENSITIVE Sensitive     NITROFURANTOIN <=16 SENSITIVE Sensitive     TRIMETH/SULFA <=20 SENSITIVE Sensitive     AMPICILLIN/SULBACTAM <=2 SENSITIVE Sensitive     PIP/TAZO <=4 SENSITIVE Sensitive     Extended ESBL NEGATIVE Sensitive     * >=100,000 COLONIES/mL ESCHERICHIA COLI    Asymptomatic bacteriuria. No antibiotics needed.  ED Provider: Lenn Sink, PA-C   Julie Novak 05/18/2019, 9:45 AM Clinical Pharmacist Monday - Friday phone -  [phone] Saturday - Sunday phone - [phone]

## 2019-05-18 NOTE — Telephone Encounter (Signed)
No abx for [facility] ED 05/15/2019 per Lenn Sink [facility]

## 2019-10-21 ENCOUNTER — Emergency Department (HOSPITAL_BASED_OUTPATIENT_CLINIC_OR_DEPARTMENT_OTHER)
Admission: EM | Admit: 2019-10-21 | Discharge: 2019-10-21 | Disposition: A | Discharge status: Home / Self Care | Payer: BC Managed Care – PPO | Attending: Emergency Medicine | Admitting: Emergency Medicine

## 2019-10-21 ENCOUNTER — Encounter (HOSPITAL_BASED_OUTPATIENT_CLINIC_OR_DEPARTMENT_OTHER): Payer: Self-pay

## 2019-10-21 ENCOUNTER — Other Ambulatory Visit: Payer: Self-pay

## 2019-10-21 DIAGNOSIS — F17210 Nicotine dependence, cigarettes, uncomplicated: Secondary | ICD-10-CM | POA: Diagnosis not present

## 2019-10-21 DIAGNOSIS — H9203 Otalgia, bilateral: Secondary | ICD-10-CM | POA: Diagnosis not present

## 2019-10-21 DIAGNOSIS — J029 Acute pharyngitis, unspecified: Secondary | ICD-10-CM | POA: Diagnosis present

## 2019-10-21 DIAGNOSIS — J302 Other seasonal allergic rhinitis: Secondary | ICD-10-CM | POA: Diagnosis not present

## 2019-10-21 NOTE — ED Provider Notes (Signed)
[place] EMERGENCY DEPARTMENT Provider Note   CSN: [id number] Arrival date & time: 10/21/19  1905     History Chief Complaint  Patient presents with  . URI    Julie Novak is a 39 y.o. female with a past medical history of ovarian tumor who presents today for evaluation of sore throat and ear pain. Tonight she developed pain in her throat.  She reports mild postnasal drip.  She denies any headache, fevers, nausea vomiting or diarrhea.  No known Covid exposures.  She denies any cough or shortness of breath.  She reports that she feels like her sore throat is due to her seasonal allergies.  Her primary concern today is that both of her ears are hurting.  She denies any new exposures, or any compression on her ears.  She states that wearing a mask makes her ears feel better however the outsides of both of her ears hurt.  She reports she took Benadryl this morning which temporarily relieved all of her symptoms however made her feel very drowsy. HPI     Past Medical History:  Diagnosis Date  . Anemia   . Blood transfusion without reported diagnosis   . 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   Past Surgical History:  Procedure Laterality Date  . ABDOMINAL SURGERY     ovary tumor     OB History   No obstetric history on file.     No family history on file.  Social History   Tobacco Use  . Smoking status: Current Every Day Smoker    Types: Cigarettes  . Smokeless tobacco: Never Used  Substance Use Topics  . Alcohol use: Yes    Comment: occ  . Drug use: No    Home Medications Prior to Admission medications   Medication Sig Start Date End Date Taking? Authorizing Provider  amoxicillin-clavulanate (AUGMENTIN) 875-125 MG tablet Take 1 tablet by mouth 2 (two) times daily.    [provider]  benzonatate (TESSALON) 100 MG capsule Take 1 capsule (100 mg total) by mouth every 8  (eight) hours. 08/28/16   Larene Pickett, PA-C  ibuprofen (ADVIL,MOTRIN) 800 MG tablet Take 1 tablet (800 mg total) by mouth 3 (three) times daily. 08/28/16   Larene Pickett, PA-C  mupirocin ointment (BACTROBAN) 2 % Apply to affected area twice daily. 12/18/15   Ward, Ozella Almond, PA-C  omeprazole (PRILOSEC) 20 MG capsule Take 1 capsule (20 mg total) by mouth daily. 05/15/19   Hayden Rasmussen, MD  ondansetron (ZOFRAN) 4 MG tablet Take 1 tablet (4 mg total) by mouth every 6 (six) hours. 09/24/17   McDonald, Mia A, PA-C  potassium chloride (K-DUR) 10 MEQ tablet Take 1 tablet (10 mEq total) by mouth 2 (two) times daily for 3 days. 09/24/17 09/27/17  McDonald, Mia A, PA-C  tamsulosin (FLOMAX) 0.4 MG CAPS capsule Take 0.4 mg by mouth daily.    [provider]  traMADol (ULTRAM) 50 MG tablet Take by mouth every 6 (six) hours as needed.    [provider]    Allergies    Patient has no known allergies.  Review of Systems   Review of Systems  Constitutional: Negative for chills, fatigue and fever.  HENT: Positive for ear pain, postnasal drip and sore throat. Negative for congestion, ear discharge, facial swelling, rhinorrhea, sinus pressure, sinus pain and sneezing.   Eyes:  Negative for visual disturbance.  Respiratory: Negative for cough, chest tightness and shortness of breath.   Cardiovascular: Negative for chest pain.  Gastrointestinal: Negative for abdominal pain, diarrhea, nausea and vomiting.  Musculoskeletal: Negative for back pain, neck pain and neck stiffness.  Neurological: Negative for headaches.  All other systems reviewed and are negative.   Physical Exam Updated Vital Signs BP 116/85   Pulse 86   Temp 99.2 F (37.3 C) (Oral)   Resp 16   Ht 5' (1.524 m)   Wt 52.7 kg   SpO2 100%   BMI 22.67 kg/m   Physical Exam Vitals and nursing note reviewed.  Constitutional:      General: She is not in acute distress.    Appearance: She is not ill-appearing or  toxic-appearing.  HENT:     Head: Normocephalic and atraumatic.     Right Ear: Tympanic membrane, ear canal and external ear normal. No decreased hearing noted. No drainage or swelling. There is no impacted cerumen. No foreign body. No mastoid tenderness.     Left Ear: Tympanic membrane, ear canal and external ear normal. No decreased hearing noted. No drainage or swelling. There is no impacted cerumen. No foreign body. No mastoid tenderness.     Ears:     Comments: Bilateral ears have tenderness to palpation along all of the structures of the external ear.  There is no crepitus or deformity.  No abnormal erythema, ecchymosis, lacerations, abrasions, or rash present.  There is no mastoid TTP.  Normal canals.      Nose: Nose normal.     Mouth/Throat:     Mouth: Mucous membranes are moist.     Pharynx: Oropharynx is clear. No oropharyngeal exudate or posterior oropharyngeal erythema.     Tonsils: No tonsillar exudate or tonsillar abscesses. 1+ on the right. 1+ on the left.  Eyes:     Conjunctiva/sclera: Conjunctivae normal.  Cardiovascular:     Rate and Rhythm: Normal rate.  Pulmonary:     Effort: Pulmonary effort is normal. No respiratory distress.  Musculoskeletal:     Cervical back: Normal range of motion and neck supple. No rigidity.  Lymphadenopathy:     Cervical: No cervical adenopathy.  Skin:    General: Skin is warm and dry.  Neurological:     Mental Status: She is alert.  Psychiatric:        Mood and Affect: Mood normal.        Behavior: Behavior normal.     ED Results / Procedures / Treatments   Labs (all labs ordered are listed, but only abnormal results are displayed) Labs Reviewed - No data to display  EKG None  Radiology No results found.  Procedures Procedures (including critical care time)  Medications Ordered in ED Medications - No data to display  ED Course  I have reviewed the triage vital signs and the nursing notes.  Pertinent labs & imaging  results that were available during my care of the patient were reviewed by me and considered in my medical decision making (see chart for details).    MDM Rules/Calculators/A&P                     Patient is a 39 year old woman who presents today for evaluation of pain in her lateral ears in the setting of sore throat which she attributes to seasonal allergies.  Her symptoms got worse yesterday. She took Benadryl over is not regularly using any nasal corticosteroid sprays or  second-generation antihistamines.  Recommended that she start using nasal corticosteroid sprays and second-generation antihistamines.  She has tenderness to palpation of bilateral earaches, however there is no objective abnormalities on exam.  Recommended attempting to treat seasonal allergy symptoms, and if this does not improve her ear symptoms in the next 3 to 4 days she needs to seek additional medical care and evaluation.  Do not suspect otitis externa based on bilateral nature and history.  Do not suspect mastoiditis, otitis media, or other serious cause for her bilateral ear pain at this time.  She is afebrile, not tachycardic or tachypneic and generally well-appearing on exam.  Return precautions were discussed with patient who states their understanding.  At the time of discharge patient denied any unaddressed complaints or concerns.  Patient is agreeable for discharge home.  Note: Portions of this report may have been transcribed using voice recognition software. Every effort was made to ensure accuracy; however, inadvertent computerized transcription errors may be present  Final Clinical Impression(s) / ED Diagnoses Final diagnoses:  Otalgia of both ears  Seasonal allergies    Rx / DC Orders ED Discharge Orders    None       Lorin Glass, PA-C 10/21/19 2257    Fredia Sorrow, MD 11/02/19 [phone]

## 2019-10-21 NOTE — Discharge Instructions (Addendum)
Please start using a nasal steroid spray such as Flonase or Nasacort.  These are available in generic forms at most drug stores and large stores. In addition to that please start using either Allegra, or Claritin, or Zyrtec instead of Benadryl.  I would recommend that you take these at night so if they do make you drowsy it does not matter.  In addition I would recommend warm salt water gargles of your mouth/throat.  If you develop fevers, shortness of breath, your symptoms fail to improve in 3 to 4 days, or have any other concerns please seek additional medical care and evaluation.

## 2019-10-21 NOTE — ED Triage Notes (Signed)
Pt c/o URI sx started yesterday-NAD-steady gait

## 2020-04-19 ENCOUNTER — Emergency Department (HOSPITAL_BASED_OUTPATIENT_CLINIC_OR_DEPARTMENT_OTHER)
Admission: EM | Admit: 2020-04-19 | Discharge: 2020-04-19 | Disposition: A | Discharge status: Home / Self Care | Payer: BC Managed Care – PPO | Attending: Emergency Medicine | Admitting: Emergency Medicine

## 2020-04-19 ENCOUNTER — Encounter (HOSPITAL_BASED_OUTPATIENT_CLINIC_OR_DEPARTMENT_OTHER): Payer: Self-pay

## 2020-04-19 ENCOUNTER — Other Ambulatory Visit: Payer: Self-pay

## 2020-04-19 DIAGNOSIS — Y92000 Kitchen of unspecified non-institutional (private) residence as  the place of occurrence of the external cause: Secondary | ICD-10-CM | POA: Insufficient documentation

## 2020-04-19 DIAGNOSIS — S0990XA Unspecified injury of head, initial encounter: Secondary | ICD-10-CM | POA: Diagnosis present

## 2020-04-19 DIAGNOSIS — Y93G3 Activity, cooking and baking: Secondary | ICD-10-CM | POA: Diagnosis not present

## 2020-04-19 DIAGNOSIS — W228XXA Striking against or struck by other objects, initial encounter: Secondary | ICD-10-CM | POA: Insufficient documentation

## 2020-04-19 DIAGNOSIS — F17210 Nicotine dependence, cigarettes, uncomplicated: Secondary | ICD-10-CM | POA: Insufficient documentation

## 2020-04-19 MED ORDER — ACETAMINOPHEN 160 MG/5ML PO SOLN
1000.0000 mg | Freq: Once | ORAL | Status: AC
  Administered 2020-04-19: 1000 mg via ORAL
  Filled 2020-04-19: qty 40.6

## 2020-04-19 MED ORDER — ACETAMINOPHEN 500 MG PO TABS
1000.0000 mg | ORAL_TABLET | Freq: Once | ORAL | Status: DC
  Filled 2020-04-19: qty 2

## 2020-04-19 NOTE — ED Triage Notes (Signed)
Pt states she hit herself with a stuck cabinet door last night left forehead/no break in skin or swelling noted-no LOC-HA today-NAD-steady gait

## 2020-04-19 NOTE — ED Provider Notes (Signed)
[place] EMERGENCY DEPARTMENT Provider Note   CSN: [id number] Arrival date & time: 04/19/20  1656     History Chief Complaint  Patient presents with  . Head Injury    Julie Novak is a 39 y.o. female who presents for evaluation after head injury that occurred yesterday.  She reports that she was cooking in the kitchen and states she went to open the door of the cabinet but it got stuck.  She pulled harder and states that it hit her on her forehead.  No LOC.  She reports that since then, she has had some headache and sometimes will see some spots.  She states she is not on blood thinners.  She has not had any nausea/vomiting, difficulty walking, numbness/weakness of her arms or legs.  She reports that she can still see denies any blurry vision.  The history is provided by the patient.       Past Medical History:  Diagnosis Date  . Anemia   . Blood transfusion without reported diagnosis   . 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   Past Surgical History:  Procedure Laterality Date  . ABDOMINAL SURGERY     ovary tumor     OB History   No obstetric history on file.     No family history on file.  Social History   Tobacco Use  . Smoking status: Current Every Day Smoker    Types: Cigarettes  . Smokeless tobacco: Never Used  Vaping Use  . Vaping Use: Never used  Substance Use Topics  . Alcohol use: Yes    Comment: occ  . Drug use: No    Home Medications Prior to Admission medications   Medication Sig Start Date End Date Taking? Authorizing Provider  amoxicillin-clavulanate (AUGMENTIN) 875-125 MG tablet Take 1 tablet by mouth 2 (two) times daily.    [provider]  benzonatate (TESSALON) 100 MG capsule Take 1 capsule (100 mg total) by mouth every 8 (eight) hours. 08/28/16   Larene Pickett, PA-C  ibuprofen (ADVIL,MOTRIN) 800 MG tablet Take 1 tablet (800 mg total) by mouth 3  (three) times daily. 08/28/16   Larene Pickett, PA-C  mupirocin ointment (BACTROBAN) 2 % Apply to affected area twice daily. 12/18/15   Ward, Ozella Almond, PA-C  omeprazole (PRILOSEC) 20 MG capsule Take 1 capsule (20 mg total) by mouth daily. 05/15/19   Hayden Rasmussen, MD  ondansetron (ZOFRAN) 4 MG tablet Take 1 tablet (4 mg total) by mouth every 6 (six) hours. 09/24/17   McDonald, Mia A, PA-C  potassium chloride (K-DUR) 10 MEQ tablet Take 1 tablet (10 mEq total) by mouth 2 (two) times daily for 3 days. 09/24/17 09/27/17  McDonald, Mia A, PA-C  tamsulosin (FLOMAX) 0.4 MG CAPS capsule Take 0.4 mg by mouth daily.    [provider]  traMADol (ULTRAM) 50 MG tablet Take by mouth every 6 (six) hours as needed.    [provider]    Allergies    Patient has no known allergies.  Review of Systems   Review of Systems  Eyes: Negative for visual disturbance.  Respiratory: Negative for shortness of breath.   Gastrointestinal: Negative for nausea and vomiting.  Neurological: Positive for headaches. Negative for weakness and numbness.  All other systems reviewed and are negative.   Physical Exam Updated Vital Signs BP 135/79 (BP Location: Left Arm)  Pulse 86   Temp 98.2 F (36.8 C) (Oral)   Resp 19   Wt 53.5 kg   SpO2 97%   BMI 23.05 kg/m   Physical Exam Vitals and nursing note reviewed.  Constitutional:      Appearance: She is well-developed.  HENT:     Head: Normocephalic and atraumatic. No raccoon eyes or Battle's sign.      Comments: Mild tenderness noted to the left forehead.  No deformity or crepitus noted.  No open wound.    Right Ear: Tympanic membrane normal. No hemotympanum.     Left Ear: Tympanic membrane normal. No hemotympanum.  Eyes:     General: No scleral icterus.       Right eye: No discharge.        Left eye: No discharge.     Conjunctiva/sclera: Conjunctivae normal.     Comments: PERRL. EOMs intact. No nystagmus. No neglect.   Pulmonary:      Effort: Pulmonary effort is normal.  Skin:    General: Skin is warm and dry.  Neurological:     Mental Status: She is alert.     Comments: Cranial nerves III-XII intact Follows commands, Moves all extremities  5/5 strength to BUE and BLE  Sensation intact throughout all major nerve distributions No pronator drift. No gait abnormalities  No slurred speech. No facial droop.   Psychiatric:        Speech: Speech normal.        Behavior: Behavior normal.     ED Results / Procedures / Treatments   Labs (all labs ordered are listed, but only abnormal results are displayed) Labs Reviewed - No data to display  EKG None  Radiology No results found.  Procedures Procedures (including critical care time)  Medications Ordered in ED Medications  acetaminophen (TYLENOL) 160 MG/5ML solution 1,000 mg (1,000 mg Oral Given 04/19/20 2121)    ED Course  I have reviewed the triage vital signs and the nursing notes.  Pertinent labs & imaging results that were available during my care of the patient were reviewed by me and considered in my medical decision making (see chart for details).    MDM Rules/Calculators/A&P                          39 year old female who presents for evaluation of head injury that occurred yesterday.  Patient reports that she was at home and states she was cooking.  She opened the cabinet door and states it got stuck and hit her on the head.  No LOC.  She is not on blood thinners.  No numbness/weakness, nausea/vomiting.  She does report occasionally, she will see some spots but otherwise no blurry vision, double vision.  Initially arrival, she is afebrile, nontoxic-appearing.  Vital signs are stable.  On exam, she has no neurological deficits.  She does report some tenderness noted to her forehead.  On my evaluation, there is no skull deformity or crepitus noted.  Exam not concerning for to cranial hemorrhage, skull fracture.  Exam consistent with minor head injury.  I  discussed with patient that she could have some concussion symptoms. Given reassuring physical exam and per [facility] CT criteria, no imaging is indicated at this time.  We discussed at home supportive care measures. At this time, patient exhibits no emergent life-threatening condition that require further evaluation in ED. Patient had ample opportunity for questions and discussion. All patient's questions were answered with  full understanding. Strict return precautions discussed. Patient expresses understanding and agreement to plan.   Portions of this note were generated with [employer]. Dictation errors may occur despite best attempts at proofreading.   Final Clinical Impression(s) / ED Diagnoses Final diagnoses:  Minor head injury, initial encounter    Rx / DC Orders ED Discharge Orders    None       Desma Mcgregor 04/19/20 2348    Charlesetta Shanks, MD 04/29/20 1300

## 2020-04-19 NOTE — Discharge Instructions (Signed)
As we discussed, your exam today is reassuring.  As we discussed, concussion symptoms can sometimes follow a head injury.  You should engage in brain rest, this includes limiting the amount of screen, phone, TV, computer time to avoid stimulating the brain.  Additionally, he should refrain from physical activity for 2 weeks.  Symptoms can sometimes persist between 2 to 4 weeks.  If after 2 weeks, you are still having symptoms.  I provided you a referral to concussion doctor.  Return emergency department for any vomiting, numbness/weakness of your arms or legs, difficulty walking, any other worsening or concerning symptoms.

## 2021-02-26 ENCOUNTER — Other Ambulatory Visit: Payer: Self-pay

## 2021-02-26 ENCOUNTER — Encounter (HOSPITAL_BASED_OUTPATIENT_CLINIC_OR_DEPARTMENT_OTHER): Payer: Self-pay | Admitting: *Deleted

## 2021-02-26 ENCOUNTER — Emergency Department (HOSPITAL_BASED_OUTPATIENT_CLINIC_OR_DEPARTMENT_OTHER)
Admission: EM | Admit: 2021-02-26 | Discharge: 2021-02-26 | Disposition: A | Discharge status: Home / Self Care | Payer: BC Managed Care – PPO | Attending: Emergency Medicine | Admitting: Emergency Medicine

## 2021-02-26 DIAGNOSIS — R202 Paresthesia of skin: Secondary | ICD-10-CM | POA: Diagnosis not present

## 2021-02-26 DIAGNOSIS — F17210 Nicotine dependence, cigarettes, uncomplicated: Secondary | ICD-10-CM | POA: Diagnosis not present

## 2021-02-26 DIAGNOSIS — M791 Myalgia, unspecified site: Secondary | ICD-10-CM | POA: Insufficient documentation

## 2021-02-26 DIAGNOSIS — M542 Cervicalgia: Secondary | ICD-10-CM | POA: Diagnosis present

## 2021-02-26 DIAGNOSIS — M5412 Radiculopathy, cervical region: Secondary | ICD-10-CM | POA: Insufficient documentation

## 2021-02-26 MED ORDER — KETOROLAC TROMETHAMINE 15 MG/ML IJ SOLN
15.0000 mg | Freq: Once | INTRAMUSCULAR | Status: AC
  Administered 2021-02-26: 15 mg via INTRAMUSCULAR
  Filled 2021-02-26: qty 1

## 2021-02-26 MED ORDER — MORPHINE SULFATE 15 MG PO TABS: 7.5000 mg | ORAL_TABLET | ORAL | 0 refills | Status: AC | PRN

## 2021-02-26 NOTE — ED Triage Notes (Signed)
C/o  left  left shoulder pain and numbness to left hand x 2 days, denies injury

## 2021-02-26 NOTE — Discharge Instructions (Addendum)

## 2021-02-26 NOTE — ED Provider Notes (Signed)
[place] EMERGENCY DEPARTMENT Provider Note   CSN: [id number] Arrival date & time: 02/26/21  1619     History Chief Complaint  Patient presents with   Numbness    Julie Novak is a 40 y.o. female.  40 yo F with 2 days of left-sided arm pain.  Radiates from her shoulder down to her fingers.  Described as a tingling.  Worse with certain movements of her head.  Denies trauma denies headache denies confusion.  Denies weakness to the arm.  Has never had this before.  The history is provided by the patient.  Illness Severity:  Moderate Onset quality:  Gradual Duration:  2 days Timing:  Constant Progression:  Worsening Chronicity:  New Associated symptoms: myalgias   Associated symptoms: no chest pain, no congestion, no fever, no headaches, no nausea, no rhinorrhea, no shortness of breath, no vomiting and no wheezing       Past Medical History:  Diagnosis Date   Anemia    Blood transfusion without reported diagnosis    Granular cell tumor    Granulosa cell tumor of ovary     There are no problems to display for this patient.   Past Surgical History:  Procedure Laterality Date   ABDOMINAL SURGERY     ovary tumor     OB History   No obstetric history on file.     No family history on file.  Social History   Tobacco Use   Smoking status: Every Day    Packs/day: 0.50    Types: Cigarettes   Smokeless tobacco: Never  Vaping Use   Vaping Use: Never used  Substance Use Topics   Alcohol use: Yes    Comment: occ   Drug use: No    Home Medications Prior to Admission medications   Medication Sig Start Date End Date Taking? Authorizing Provider  morphine (MSIR) 15 MG tablet Take 0.5 tablets (7.5 mg total) by mouth every 4 (four) hours as needed for severe pain. 02/26/21  Yes Deno Etienne, DO  amoxicillin-clavulanate (AUGMENTIN) 875-125 MG tablet Take 1 tablet by mouth 2 (two) times daily.    [provider]  benzonatate (TESSALON) 100 MG capsule  Take 1 capsule (100 mg total) by mouth every 8 (eight) hours. 08/28/16   Larene Pickett, PA-C  ibuprofen (ADVIL,MOTRIN) 800 MG tablet Take 1 tablet (800 mg total) by mouth 3 (three) times daily. 08/28/16   Larene Pickett, PA-C  mupirocin ointment (BACTROBAN) 2 % Apply to affected area twice daily. 12/18/15   Ward, Ozella Almond, PA-C  omeprazole (PRILOSEC) 20 MG capsule Take 1 capsule (20 mg total) by mouth daily. 05/15/19   Hayden Rasmussen, MD  ondansetron (ZOFRAN) 4 MG tablet Take 1 tablet (4 mg total) by mouth every 6 (six) hours. 09/24/17   McDonald, Mia A, PA-C  potassium chloride (K-DUR) 10 MEQ tablet Take 1 tablet (10 mEq total) by mouth 2 (two) times daily for 3 days. 09/24/17 09/27/17  McDonald, Mia A, PA-C  tamsulosin (FLOMAX) 0.4 MG CAPS capsule Take 0.4 mg by mouth daily.    [provider]  traMADol (ULTRAM) 50 MG tablet Take by mouth every 6 (six) hours as needed.    [provider]    Allergies    Patient has no known allergies.  Review of Systems   Review of Systems  Constitutional:  Negative for chills and fever.  HENT:  Negative for congestion and rhinorrhea.   Eyes:  Negative for redness  and visual disturbance.  Respiratory:  Negative for shortness of breath and wheezing.   Cardiovascular:  Negative for chest pain and palpitations.  Gastrointestinal:  Negative for nausea and vomiting.  Genitourinary:  Negative for dysuria and urgency.  Musculoskeletal:  Positive for arthralgias, myalgias and neck pain.  Skin:  Negative for pallor and wound.  Neurological:  Negative for dizziness and headaches.   Physical Exam Updated Vital Signs BP 124/87 (BP Location: Right Arm)   Pulse 83   Temp 98.7 F (37.1 C) (Oral)   Resp 18   Ht 5' (1.524 m)   Wt 53.5 kg   SpO2 100%   BMI 23.05 kg/m   Physical Exam Vitals and nursing note reviewed.  Constitutional:      General: She is not in acute distress.    Appearance: She is well-developed. She is not diaphoretic.   HENT:     Head: Normocephalic and atraumatic.  Eyes:     Pupils: Pupils are equal, round, and reactive to light.  Cardiovascular:     Rate and Rhythm: Normal rate and regular rhythm.     Heart sounds: No murmur heard.   No friction rub. No gallop.  Pulmonary:     Effort: Pulmonary effort is normal.     Breath sounds: No wheezing or rales.  Abdominal:     General: There is no distension.     Palpations: Abdomen is soft.     Tenderness: There is no abdominal tenderness.  Musculoskeletal:        General: Tenderness present.     Cervical back: Normal range of motion and neck supple.     Comments: Tenderness and spasm to the left trapezius.  Pulse motor and sensation intact distally.  No pain with range of motion of the C-spine.  Positive Spurling's test.  4/4 muscle strength compared to the other side.  Skin:    General: Skin is warm and dry.  Neurological:     Mental Status: She is alert and oriented to person, place, and time.  Psychiatric:        Behavior: Behavior normal.    ED Results / Procedures / Treatments   Labs (all labs ordered are listed, but only abnormal results are displayed) Labs Reviewed - No data to display  EKG None  Radiology No results found.  Procedures Procedures   Medications Ordered in ED Medications  ketorolac (TORADOL) 15 MG/ML injection 15 mg (15 mg Intramuscular Given 02/26/21 1804)    ED Course  I have reviewed the triage vital signs and the nursing notes.  Pertinent labs & imaging results that were available during my care of the patient were reviewed by me and considered in my medical decision making (see chart for details).    MDM Rules/Calculators/A&P                           40yo F with a chief complaints of left-sided radicular pain into the hand.  Going on for couple days.  Most likely the patient has a cervical radiculopathy.  She does have a positive Spurling's test.  We will sling for comfort.  Neurosurgery  follow-up.  6:45 PM:  I have discussed the diagnosis/risks/treatment options with the patient and believe the pt to be eligible for discharge home to follow-up with Neurosurgery. We also discussed returning to the ED immediately if new or worsening sx occur. We discussed the sx which are most concerning (e.g., sudden worsening  pain, fever, inability to tolerate by mouth) that necessitate immediate return. Medications administered to the patient during their visit and any new prescriptions provided to the patient are listed below.  Medications given during this visit Medications  ketorolac (TORADOL) 15 MG/ML injection 15 mg (15 mg Intramuscular Given 02/26/21 1804)     The patient appears reasonably screen and/or stabilized for discharge and I doubt any other medical condition or other [facility] requiring further screening, evaluation, or treatment in the ED at this time prior to discharge.   Final Clinical Impression(s) / ED Diagnoses Final diagnoses:  Cervical radiculopathy    Rx / DC Orders ED Discharge Orders          Ordered    morphine (MSIR) 15 MG tablet  Every 4 hours PRN        02/26/21 1759             Deno Etienne, DO 02/26/21 1845

## 2021-02-27 ENCOUNTER — Emergency Department (HOSPITAL_BASED_OUTPATIENT_CLINIC_OR_DEPARTMENT_OTHER)
Admission: EM | Admit: 2021-02-27 | Discharge: 2021-02-27 | Discharge status: Absent without leave | Payer: BC Managed Care – PPO

## 2021-06-06 ENCOUNTER — Encounter (HOSPITAL_BASED_OUTPATIENT_CLINIC_OR_DEPARTMENT_OTHER): Payer: Self-pay | Admitting: Emergency Medicine

## 2021-06-06 ENCOUNTER — Emergency Department (HOSPITAL_BASED_OUTPATIENT_CLINIC_OR_DEPARTMENT_OTHER)
Admission: EM | Admit: 2021-06-06 | Discharge: 2021-06-06 | Disposition: A | Discharge status: Home / Self Care | Payer: BC Managed Care – PPO | Attending: Emergency Medicine | Admitting: Emergency Medicine

## 2021-06-06 ENCOUNTER — Emergency Department (HOSPITAL_BASED_OUTPATIENT_CLINIC_OR_DEPARTMENT_OTHER): Payer: BC Managed Care – PPO

## 2021-06-06 ENCOUNTER — Other Ambulatory Visit: Payer: Self-pay

## 2021-06-06 DIAGNOSIS — R112 Nausea with vomiting, unspecified: Secondary | ICD-10-CM | POA: Diagnosis not present

## 2021-06-06 DIAGNOSIS — F17210 Nicotine dependence, cigarettes, uncomplicated: Secondary | ICD-10-CM | POA: Diagnosis not present

## 2021-06-06 DIAGNOSIS — R1011 Right upper quadrant pain: Secondary | ICD-10-CM | POA: Diagnosis not present

## 2021-06-06 LAB — URINALYSIS, ROUTINE W REFLEX MICROSCOPIC
Bilirubin Urine: NEGATIVE
Glucose, UA: NEGATIVE mg/dL
Ketones, ur: 15 mg/dL — AB
Leukocytes,Ua: NEGATIVE
Nitrite: NEGATIVE
Protein, ur: NEGATIVE mg/dL
Specific Gravity, Urine: 1.025 (ref 1.005–1.030)
pH: 7 (ref 5.0–8.0)

## 2021-06-06 LAB — COMPREHENSIVE METABOLIC PANEL
ALT: 21 U/L (ref 0–44)
AST: 19 U/L (ref 15–41)
Albumin: 4.1 g/dL (ref 3.5–5.0)
Alkaline Phosphatase: 72 U/L (ref 38–126)
Anion gap: 9 (ref 5–15)
BUN: 11 mg/dL (ref 6–20)
CO2: 25 mmol/L (ref 22–32)
Calcium: 9.1 mg/dL (ref 8.9–10.3)
Chloride: 102 mmol/L (ref 98–111)
Creatinine, Ser: 0.58 mg/dL (ref 0.44–1.00)
GFR, Estimated: >60 mL/min (ref >60)
Glucose, Bld: 82 mg/dL (ref 70–99)
Potassium: 3.6 mmol/L (ref 3.5–5.1)
Sodium: 136 mmol/L (ref 135–145)
Total Bilirubin: 0.5 mg/dL (ref 0.3–1.2)
Total Protein: 7.4 g/dL (ref 6.5–8.1)

## 2021-06-06 LAB — CBC WITH DIFFERENTIAL/PLATELET
Abs Immature Granulocytes: 0.01 10*3/uL (ref 0.00–0.07)
Basophils Absolute: 0 10*3/uL (ref 0.0–0.1)
Basophils Relative: 0 %
Eosinophils Absolute: 0.2 10*3/uL (ref 0.0–0.5)
Eosinophils Relative: 4 %
HCT: 36.4 % (ref 36.0–46.0)
Hemoglobin: 11.8 g/dL — ABNORMAL LOW (ref 12.0–15.0)
Immature Granulocytes: 0 %
Lymphocytes Relative: 34 %
Lymphs Abs: 1.9 10*3/uL (ref 0.7–4.0)
MCH: 23.4 pg — ABNORMAL LOW (ref 26.0–34.0)
MCHC: 32.4 g/dL (ref 30.0–36.0)
MCV: 72.2 fL — ABNORMAL LOW (ref 80.0–100.0)
Monocytes Absolute: 0.6 10*3/uL (ref 0.1–1.0)
Monocytes Relative: 11 %
Neutro Abs: 2.7 10*3/uL (ref 1.7–7.7)
Neutrophils Relative %: 51 %
Platelets: 268 10*3/uL (ref 150–400)
RBC: 5.04 MIL/uL (ref 3.87–5.11)
RDW: 14.4 % (ref 11.5–15.5)
WBC: 5.4 10*3/uL (ref 4.0–10.5)
nRBC: 0 % (ref 0.0–0.2)

## 2021-06-06 LAB — URINALYSIS, MICROSCOPIC (REFLEX)

## 2021-06-06 LAB — PREGNANCY, URINE: Preg Test, Ur: NEGATIVE

## 2021-06-06 LAB — LIPASE, BLOOD: Lipase: 38 U/L (ref 11–51)

## 2021-06-06 MED ORDER — SODIUM CHLORIDE 0.9 % IV BOLUS
1000.0000 mL | Freq: Once | INTRAVENOUS | Status: AC
  Administered 2021-06-06: 1000 mL via INTRAVENOUS

## 2021-06-06 MED ORDER — ONDANSETRON HCL 4 MG/2ML IJ SOLN
4.0000 mg | Freq: Once | INTRAMUSCULAR | Status: AC
  Administered 2021-06-06: 4 mg via INTRAVENOUS
  Filled 2021-06-06: qty 2

## 2021-06-06 MED ORDER — IOHEXOL 300 MG/ML  SOLN
100.0000 mL | Freq: Once | INTRAMUSCULAR | Status: AC | PRN
  Administered 2021-06-06: 100 mL via INTRAVENOUS

## 2021-06-06 MED ORDER — KETOROLAC TROMETHAMINE 15 MG/ML IJ SOLN
15.0000 mg | Freq: Once | INTRAMUSCULAR | Status: AC
  Administered 2021-06-06: 15 mg via INTRAVENOUS
  Filled 2021-06-06: qty 1

## 2021-06-06 MED ORDER — CEPHALEXIN 500 MG PO CAPS: 500.0000 mg | ORAL_CAPSULE | Freq: Four times a day (QID) | ORAL | 0 refills | Status: AC

## 2021-06-06 MED ORDER — ONDANSETRON HCL 4 MG PO TABS: 4.0000 mg | ORAL_TABLET | Freq: Four times a day (QID) | ORAL | 0 refills | Status: AC

## 2021-06-06 NOTE — Discharge Instructions (Addendum)
Your blood work looks okay today.  Your CT scan showed a large uterine fibroid, you may follow-up about this with an OB/GYN.  I am also going to treat you for urinary tract infection.  Please take the antibiotics as prescribed.  There should also be Zofran, for your nausea at the pharmacy.  It was a pleasure to meet you and I hope that you feel better.

## 2021-06-06 NOTE — ED Notes (Signed)
Patient discharged home to self.  All discharge instructions reviewed.  Patient demonstrated knowledge via teachback method.  VS WDL.  Ambulatory out of ED.

## 2021-06-06 NOTE — ED Triage Notes (Signed)
Pt c/o upper abdominal pain, n/v, loss of appetite since yesterday. Denies cp, shob, diarrhea, fevers. LBM yesterday.

## 2021-06-06 NOTE — ED Provider Notes (Signed)
[facility] Provider Note   CSN: [id number] Arrival date & time: 06/06/21  1700     History Chief Complaint  Patient presents with   Abdominal Pain   Emesis    Julie Novak is a 40 y.o. female with a past medical history of B12 deficiency presenting today with a complaint of abdominal pain, nausea and vomiting that began yesterday.  No sick contacts.  Did not change anything in her diet.  No hematemesis.  Denies any fevers, chills, diarrhea or constipation.  Has not had any episodes of emesis today.  Currently complaining of abdominal pain.  No urinary symptoms.  Sexually active, without protection however says that she cannot be pregnant.  No abnormal vaginal discharge.  She states she ginger ale, crackers and soup however this does not seem to help her symptoms.  Was given one of her mother's Zofran and she states that this helped.    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   Past Surgical History:  Procedure Laterality Date   ABDOMINAL SURGERY     ovary tumor     OB History   No obstetric history on file.     No family history on file.  Social History   Tobacco Use   Smoking status: Every Day    Packs/day: 0.50    Types: Cigarettes   Smokeless tobacco: Never  Vaping Use   Vaping Use: Never used  Substance Use Topics   Alcohol use: Yes    Comment: occ   Drug use: No    Home Medications Prior to Admission medications   Medication Sig Start Date End Date Taking? Authorizing Provider  amoxicillin-clavulanate (AUGMENTIN) 875-125 MG tablet Take 1 tablet by mouth 2 (two) times daily.    [provider]  benzonatate (TESSALON) 100 MG capsule Take 1 capsule (100 mg total) by mouth every 8 (eight) hours. 08/28/16   Larene Pickett, PA-C  ibuprofen  (ADVIL,MOTRIN) 800 MG tablet Take 1 tablet (800 mg total) by mouth 3 (three) times daily. 08/28/16   Larene Pickett, PA-C  morphine (MSIR) 15 MG tablet Take 0.5 tablets (7.5 mg total) by mouth every 4 (four) hours as needed for severe pain. 02/26/21   Deno Etienne, DO  mupirocin ointment (BACTROBAN) 2 % Apply to affected area twice daily. 12/18/15   Ward, Ozella Almond, PA-C  omeprazole (PRILOSEC) 20 MG capsule Take 1 capsule (20 mg total) by mouth daily. 05/15/19   Hayden Rasmussen, MD  ondansetron (ZOFRAN) 4 MG tablet Take 1 tablet (4 mg total) by mouth every 6 (six) hours. 09/24/17   McDonald, Mia A, PA-C  potassium chloride (K-DUR) 10 MEQ tablet Take 1 tablet (10 mEq total) by mouth 2 (two) times daily for 3 days. 09/24/17 09/27/17  McDonald, Mia A, PA-C  tamsulosin (FLOMAX) 0.4 MG CAPS capsule Take 0.4 mg by mouth daily.    [provider]  traMADol (ULTRAM) 50 MG tablet Take by mouth every 6 (six) hours as needed.    [provider]    Allergies    Patient has no known allergies.  Review of Systems   Review of Systems  Constitutional:  Negative for chills and fever.  HENT:  Negative for sore throat.   Gastrointestinal:  Positive  for abdominal pain and vomiting. Negative for constipation and diarrhea.  Genitourinary:  Negative for dysuria, hematuria, vaginal bleeding and vaginal discharge.  All other systems reviewed and are negative.  Physical Exam Updated Vital Signs BP 117/77 (BP Location: Right Arm)   Pulse 72   Temp 98.3 F (36.8 C) (Oral)   Resp 18   Ht 5' (1.524 m)   Wt 55.3 kg   SpO2 100%   BMI 23.83 kg/m   Physical Exam Vitals and nursing note reviewed.  Constitutional:      General: She is not in acute distress.    Appearance: Normal appearance. She is well-developed. She is not ill-appearing.  HENT:     Head: Normocephalic and atraumatic.  Eyes:     General: No scleral icterus.    Conjunctiva/sclera: Conjunctivae normal.  Pulmonary:     Effort:  Pulmonary effort is normal. No respiratory distress.     Breath sounds: No wheezing or rales.  Abdominal:     General: There is no distension.     Palpations: Abdomen is soft. There is no mass.     Tenderness: There is abdominal tenderness in the right upper quadrant. Negative signs include Murphy's sign.     Hernia: No hernia is present.  Skin:    General: Skin is warm and dry.     Findings: No rash.  Neurological:     Mental Status: She is alert.  Psychiatric:        Mood and Affect: Mood normal.    ED Results / Procedures / Treatments   Labs (all labs ordered are listed, but only abnormal results are displayed) Labs Reviewed  CBC WITH DIFFERENTIAL/PLATELET - Abnormal; Notable for the following components:      Result Value   Hemoglobin 11.8 (*)    MCV 72.2 (*)    MCH 23.4 (*)    All other components within normal limits  URINALYSIS, ROUTINE W REFLEX MICROSCOPIC - Abnormal; Notable for the following components:   APPearance CLOUDY (*)    Hgb urine dipstick MODERATE (*)    Ketones, ur 15 (*)    All other components within normal limits  URINALYSIS, MICROSCOPIC (REFLEX) - Abnormal; Notable for the following components:   Bacteria, UA MANY (*)    All other components within normal limits  COMPREHENSIVE METABOLIC PANEL  LIPASE, BLOOD  PREGNANCY, URINE    EKG None  Radiology CT ABDOMEN PELVIS W CONTRAST  Result Date: 06/06/2021 CLINICAL DATA:  Abdomen pain EXAM: CT ABDOMEN AND PELVIS WITH CONTRAST TECHNIQUE: Multidetector CT imaging of the abdomen and pelvis was performed using the standard protocol following bolus administration of intravenous contrast. CONTRAST:  156mL OMNIPAQUE IOHEXOL 300 MG/ML  SOLN COMPARISON:  Ultrasound 05/15/2019 FINDINGS: Lower chest: Lung bases are clear. Hepatobiliary: No calcified gallstone or biliary dilatation. Subcentimeter hypodensity in the left hepatic lobe too small to further characterize. 2.1 cm hypodense mass with patchy peripheral  enhancement in the right hepatic lobe consistent with hemangioma. Pancreas: Unremarkable. No pancreatic ductal dilatation or surrounding inflammatory changes. Spleen: Normal in size without focal abnormality. Adrenals/Urinary Tract: Adrenal glands are unremarkable. Kidneys are normal, without renal calculi, focal lesion, or hydronephrosis. Bladder is unremarkable. Stomach/Bowel: Stomach is within normal limits. Appendix appears normal. No evidence of bowel wall thickening, distention, or inflammatory changes. Vascular/Lymphatic: No suspicious nodes.  Nonaneurysmal aorta Reproductive: 5.7 cm posterior cul-de-sac mass consistent with exophytic lower uterine segment mass. 3.6 cm left adnexal cyst. Other: Negative for pelvic effusion or free air Musculoskeletal:  No acute or significant osseous findings. IMPRESSION: 1. No CT evidence for acute intra-abdominal or pelvic abnormality. 2. Large uterine fibroid. 3. Right hepatic lobe hemangioma Electronically Signed   By: Donavan Foil M.D.   On: 06/06/2021 19:48    Procedures Procedures   Medications Ordered in ED Medications  sodium chloride 0.9 % bolus 1,000 mL (has no administration in time range)  ondansetron (ZOFRAN) injection 4 mg (has no administration in time range)  ketorolac (TORADOL) 15 MG/ML injection 15 mg (has no administration in time range)    ED Course  I have reviewed the triage vital signs and the nursing notes.  Pertinent labs & imaging results that were available during my care of the patient were reviewed by me and considered in my medical decision making (see chart for details).    MDM Rules/Calculators/A&P Patient evaluated by me.  In no acute distress.  Has had no episodes of emesis today.  Still complains of abdominal pain. The differential diagnosis for generalized abdominal pain includes, but is not limited to AAA, gastroenteritis, appendicitis, Bowel obstruction, Bowel perforation. Gastroparesis, DKA, Hernia, Inflammatory bowel  disease, mesenteric ischemia, pancreatitis, peritonitis SBP, volvulus.  All of these were considered throughout the evaluation of this patient.  Abdomen was soft, right upper quadrant tenderness with a positive Murphy sign.  McBurney negative.  She denied any urinary symptoms, however her urinalysis shows hematuria without infection.  Some ketonuria, likely due to dehydration.  Blood work without abnormalities.  CT scan was obtained due to point tenderness in the right upper quadrant.  This revealed a large uterine fibroid however no other abnormalities causing her symptoms.  We discussed this and she stated that she was ready to go home.  I believe she is able to do so at this time.  She did have some hematuria and bacteriuria.  I will treat her for urinary tract infection and encouraged her to follow-up with a primary care provider in 1 week to reassess this.  Stable and ambulatory for discharge  Final Clinical Impression(s) / ED Diagnoses Final diagnoses:  Right upper quadrant abdominal pain  Nausea and vomiting, unspecified vomiting type    Rx / DC Orders Results and diagnoses were explained to the patient. Return precautions discussed in full. Patient had no additional questions and expressed complete understanding.     Rhae Hammock, PA-C 06/06/21 2005    [address] DO 06/06/21 2151

## 2021-06-19 ENCOUNTER — Other Ambulatory Visit: Payer: Self-pay | Admitting: Family Medicine

## 2021-06-19 ENCOUNTER — Ambulatory Visit
Admission: RE | Admit: 2021-06-19 | Discharge: 2021-06-19 | Disposition: A | Discharge status: Home / Self Care | Payer: No Typology Code available for payment source | Source: Ambulatory Visit | Attending: Family Medicine | Admitting: Family Medicine

## 2021-06-19 DIAGNOSIS — S99912A Unspecified injury of left ankle, initial encounter: Secondary | ICD-10-CM

## 2021-06-19 DIAGNOSIS — W19XXXA Unspecified fall, initial encounter: Secondary | ICD-10-CM

## 2022-03-08 ENCOUNTER — Encounter (HOSPITAL_BASED_OUTPATIENT_CLINIC_OR_DEPARTMENT_OTHER): Payer: Self-pay | Admitting: Emergency Medicine

## 2022-03-08 ENCOUNTER — Emergency Department (HOSPITAL_BASED_OUTPATIENT_CLINIC_OR_DEPARTMENT_OTHER): Payer: BC Managed Care – PPO

## 2022-03-08 DIAGNOSIS — M25512 Pain in left shoulder: Secondary | ICD-10-CM | POA: Insufficient documentation

## 2022-03-08 DIAGNOSIS — Z5321 Procedure and treatment not carried out due to patient leaving prior to being seen by health care provider: Secondary | ICD-10-CM | POA: Diagnosis not present

## 2022-03-08 DIAGNOSIS — Y990 Civilian activity done for income or pay: Secondary | ICD-10-CM | POA: Diagnosis not present

## 2022-03-08 NOTE — ED Triage Notes (Addendum)
Pt reports LT shoulder pain and unable to extend arm since around 1200; no injury, but did a lot of pulling and raising arms at work this morning; shoulder painful to palpation ; grip strength equal bilaterally

## 2022-03-09 ENCOUNTER — Emergency Department (HOSPITAL_BASED_OUTPATIENT_CLINIC_OR_DEPARTMENT_OTHER)
Admission: EM | Admit: 2022-03-09 | Discharge: 2022-03-09 | Disposition: A | Discharge status: Home / Self Care | Payer: BC Managed Care – PPO | Attending: Emergency Medicine | Admitting: Emergency Medicine

## 2023-05-05 IMAGING — CT CT ABD-PELV W/ CM
2 of 5 series · 16 of 46 positions shown, 18 images · IV contrast (omnipaque)
Comparison: Ultrasound 05/15/2019

CLINICAL DATA: Abdomen pain

EXAM:
CT ABDOMEN AND PELVIS WITH CONTRAST
TECHNIQUE: Multidetector CT imaging of the abdomen and pelvis was performed
using the standard protocol following bolus administration of
intravenous contrast.
CONTRAST:  100mL OMNIPAQUE IOHEXOL 300 MG/ML  SOLN

[Series 2: axial st · axial · 0.80mm/px · z∈[-692,-317]mm · 13 of 85 slices shown, 15 images]
[im 5/85  soft-tissue]
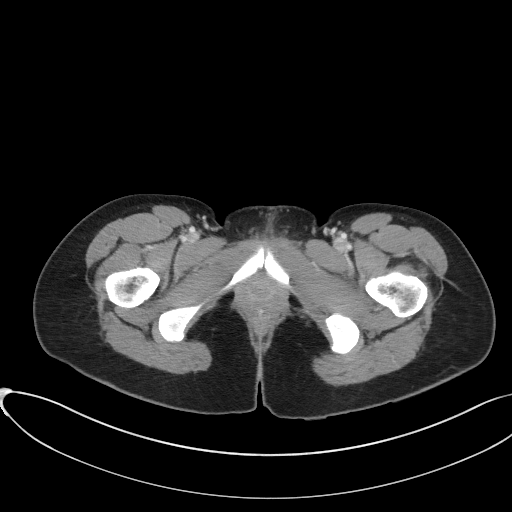
[im 5/85  bone]
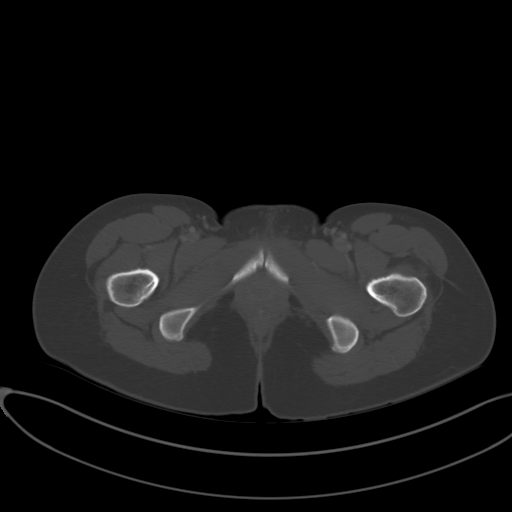
[im 14/85  soft-tissue]
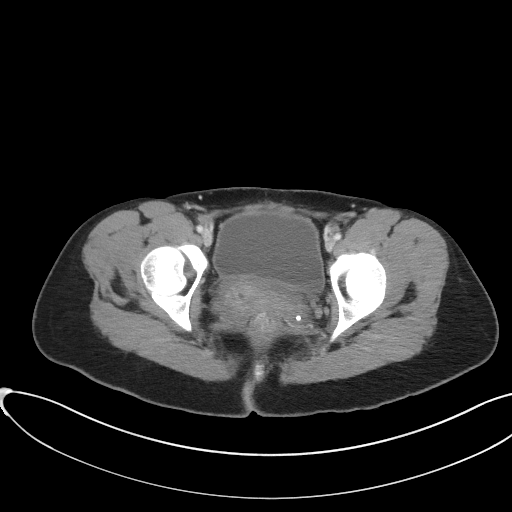
[im 18/85  soft-tissue]
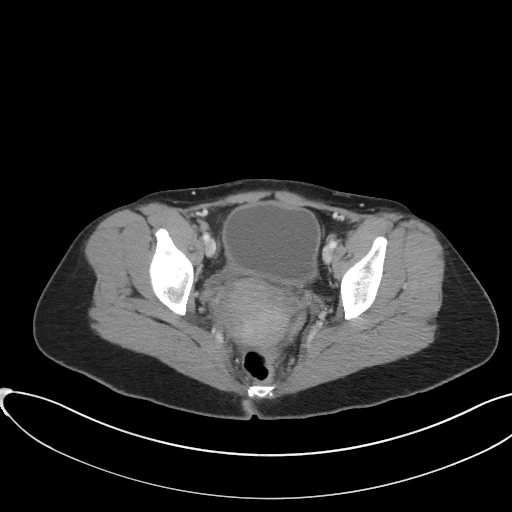
[im 23/85  soft-tissue]
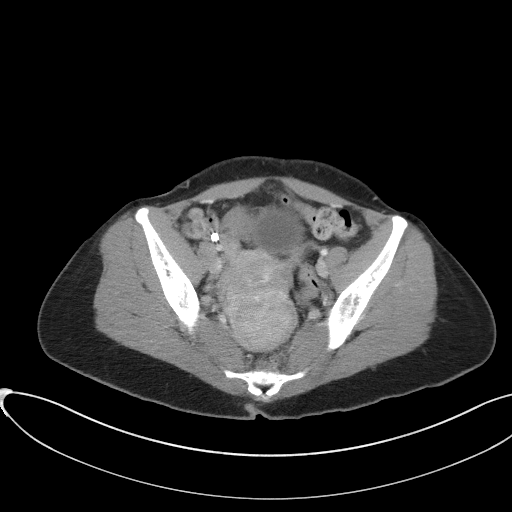
[im 31/85  soft-tissue]
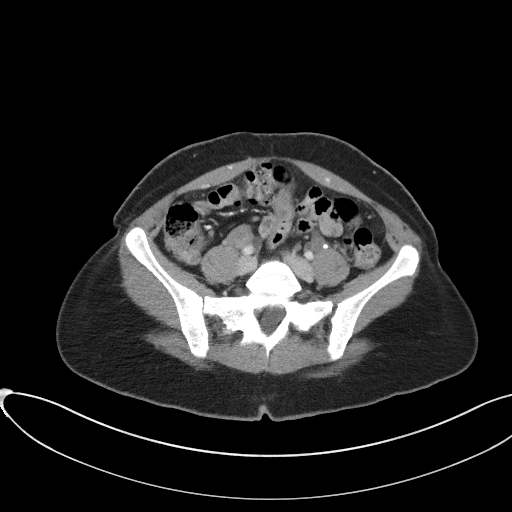
[im 36/85  soft-tissue]
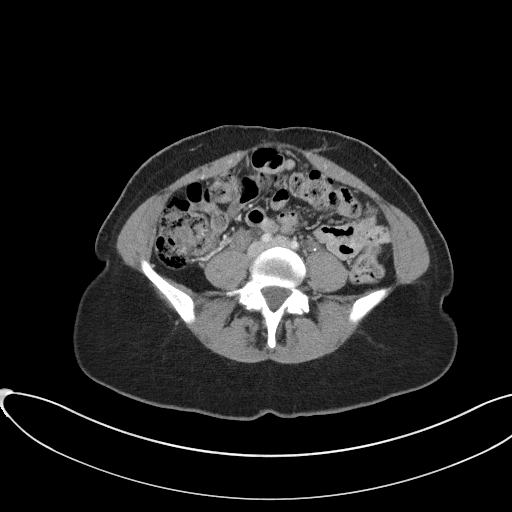
[im 45/85  soft-tissue]
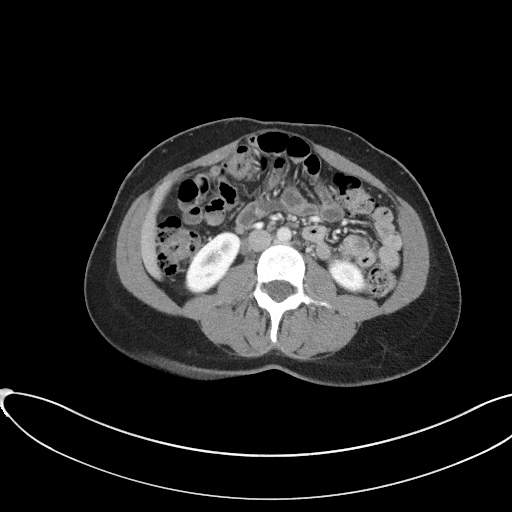
[im 49/85  soft-tissue]
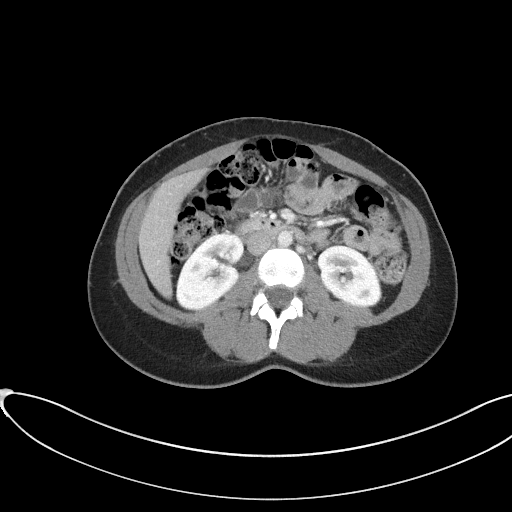
[im 54/85  soft-tissue]
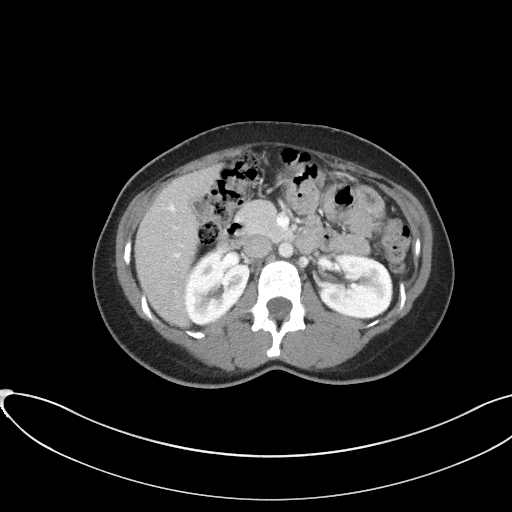
[im 54/85  bone]
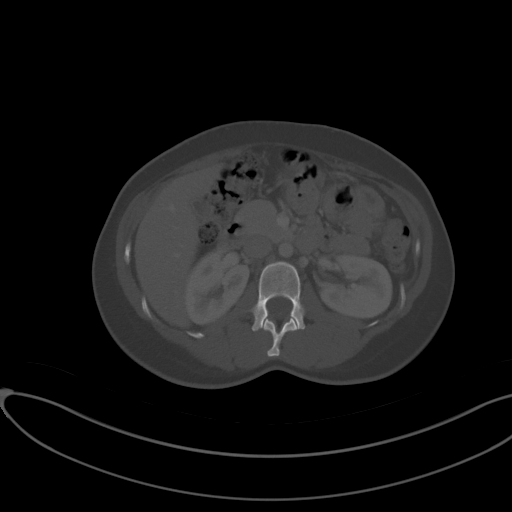
[im 62/85  soft-tissue]
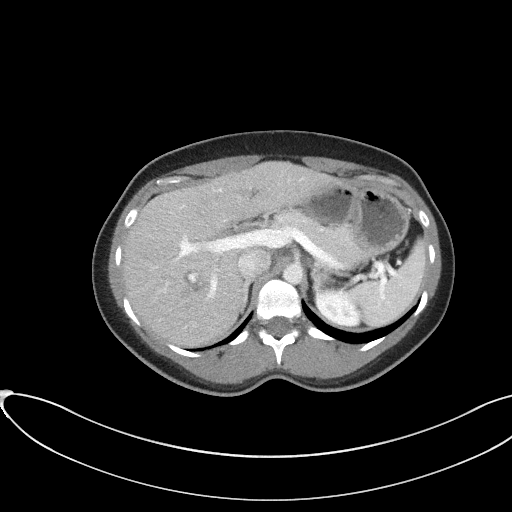
[im 67/85  soft-tissue]
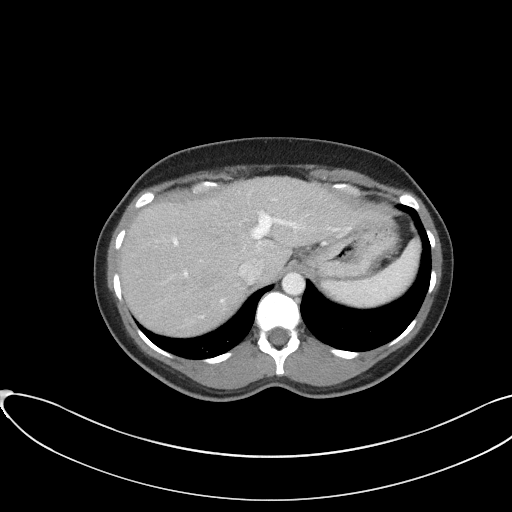
[im 71/85  soft-tissue]
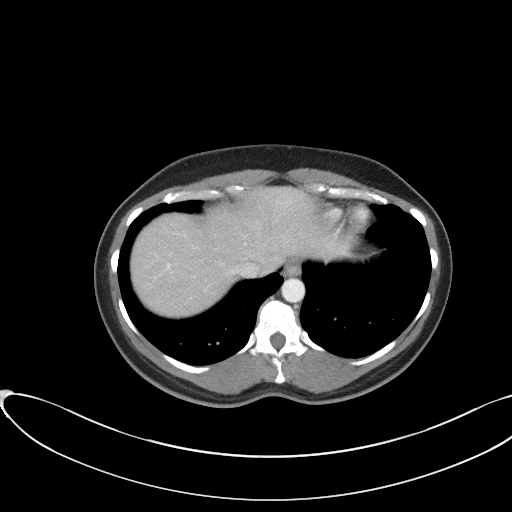
[im 80/85  soft-tissue]
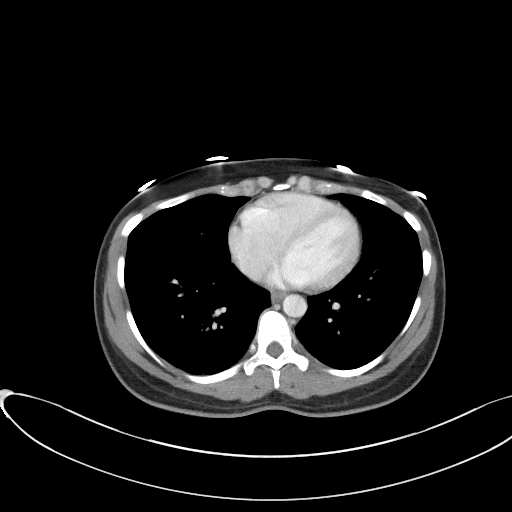

[Series 5: coronal st · coronal · 0.67mm/px · 3 of 95 slices shown]
[im 32/95  soft-tissue]
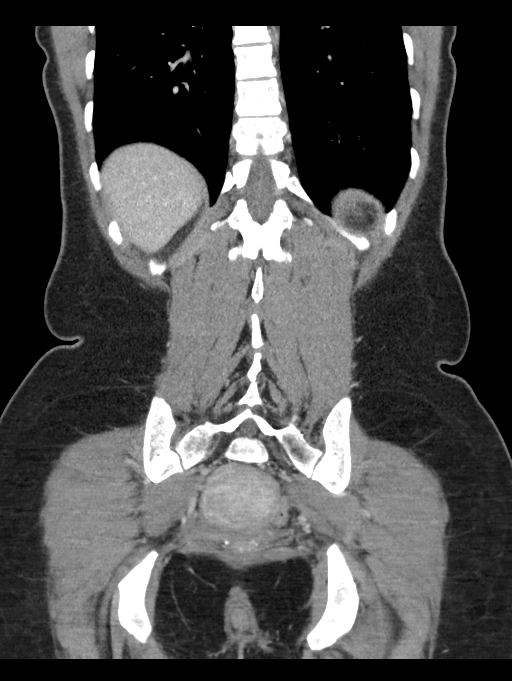
[im 42/95  soft-tissue]
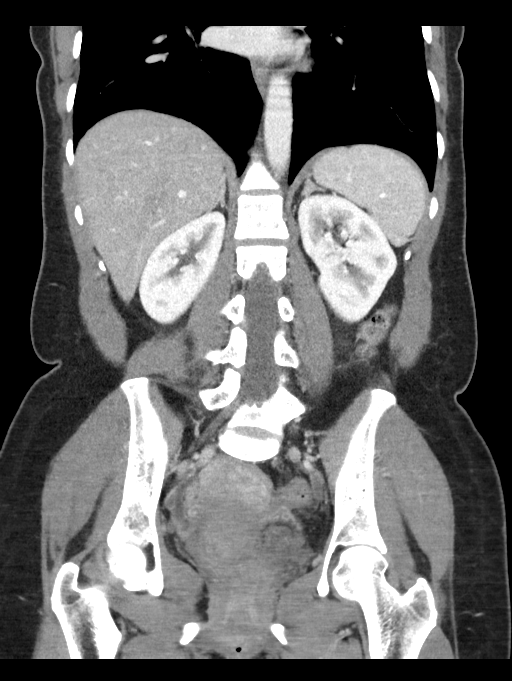
[im 53/95  soft-tissue]
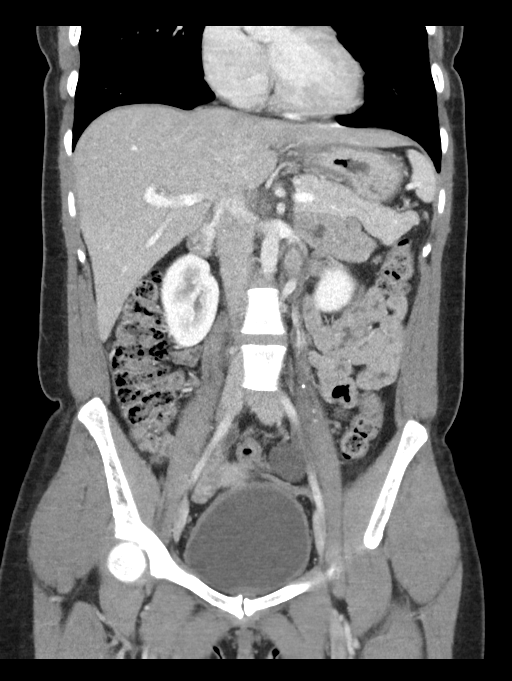

[16 of 46 positions shown; findings below may reference images not displayed]

FINDINGS: Lower chest: Lung bases are clear.

Hepatobiliary: No calcified gallstone or biliary dilatation.
Subcentimeter hypodensity in the left hepatic lobe too small to
further characterize. 2.1 cm hypodense mass with patchy peripheral
enhancement in the right hepatic lobe consistent with hemangioma.

Pancreas: Unremarkable. No pancreatic ductal dilatation or
surrounding inflammatory changes.

Spleen: Normal in size without focal abnormality.

Adrenals/Urinary Tract: Adrenal glands are unremarkable. Kidneys are
normal, without renal calculi, focal lesion, or hydronephrosis.
Bladder is unremarkable.

Stomach/Bowel: Stomach is within normal limits. Appendix appears
normal. No evidence of bowel wall thickening, distention, or
inflammatory changes.

Vascular/Lymphatic: No suspicious nodes.  Nonaneurysmal aorta

Reproductive: 5.7 cm posterior cul-de-sac mass consistent with
exophytic lower uterine segment mass. 3.6 cm left adnexal cyst.

Other: Negative for pelvic effusion or free air

Musculoskeletal: No acute or significant osseous findings.
IMPRESSION: 1. No CT evidence for acute intra-abdominal or pelvic abnormality.
2. Large uterine fibroid.
3. Right hepatic lobe hemangioma
